# Patient Record
Sex: Male | Born: 1960
Health system: Southern US, Community
[De-identification: ages and names within clinical notes are randomized; demographics above are authoritative.]

## PROBLEM LIST (undated history)

## (undated) DIAGNOSIS — F419 Anxiety disorder, unspecified: Secondary | ICD-10-CM

## (undated) DIAGNOSIS — Z973 Presence of spectacles and contact lenses: Secondary | ICD-10-CM

## (undated) DIAGNOSIS — M1990 Unspecified osteoarthritis, unspecified site: Secondary | ICD-10-CM

## (undated) DIAGNOSIS — Z87442 Personal history of urinary calculi: Secondary | ICD-10-CM

## (undated) DIAGNOSIS — A4902 Methicillin resistant Staphylococcus aureus infection, unspecified site: Secondary | ICD-10-CM

## (undated) DIAGNOSIS — T8859XA Other complications of anesthesia, initial encounter: Secondary | ICD-10-CM

## (undated) DIAGNOSIS — B1920 Unspecified viral hepatitis C without hepatic coma: Secondary | ICD-10-CM

## (undated) DIAGNOSIS — T4145XA Adverse effect of unspecified anesthetic, initial encounter: Secondary | ICD-10-CM

## (undated) HISTORY — DX: Unspecified viral hepatitis C without hepatic coma: B19.20

## (undated) HISTORY — PX: OTHER SURGICAL HISTORY: SHX169

## (undated) HISTORY — PX: UPPER GI ENDOSCOPY: SHX6162

## (undated) HISTORY — DX: Methicillin resistant Staphylococcus aureus infection, unspecified site: A49.02

## (undated) HISTORY — DX: Unspecified osteoarthritis, unspecified site: M19.90

---

## 1997-10-23 ENCOUNTER — Emergency Department (HOSPITAL_COMMUNITY): Admission: EM | Admit: 1997-10-23 | Discharge: 1997-10-23 | Payer: Self-pay | Admitting: Emergency Medicine

## 1999-07-23 ENCOUNTER — Encounter: Payer: Self-pay | Admitting: Emergency Medicine

## 1999-07-23 ENCOUNTER — Emergency Department (HOSPITAL_COMMUNITY): Admission: EM | Admit: 1999-07-23 | Discharge: 1999-07-23 | Payer: Self-pay | Admitting: Emergency Medicine

## 2001-07-07 ENCOUNTER — Emergency Department (HOSPITAL_COMMUNITY): Admission: EM | Admit: 2001-07-07 | Discharge: 2001-07-07 | Payer: Self-pay | Admitting: Emergency Medicine

## 2001-07-08 ENCOUNTER — Emergency Department (HOSPITAL_COMMUNITY): Admission: EM | Admit: 2001-07-08 | Discharge: 2001-07-08 | Payer: Self-pay

## 2001-07-09 ENCOUNTER — Emergency Department (HOSPITAL_COMMUNITY): Admission: EM | Admit: 2001-07-09 | Discharge: 2001-07-09 | Payer: Self-pay | Admitting: Emergency Medicine

## 2001-07-10 ENCOUNTER — Emergency Department (HOSPITAL_COMMUNITY): Admission: EM | Admit: 2001-07-10 | Discharge: 2001-07-10 | Payer: Self-pay | Admitting: Emergency Medicine

## 2001-07-12 ENCOUNTER — Emergency Department (HOSPITAL_COMMUNITY): Admission: EM | Admit: 2001-07-12 | Discharge: 2001-07-12 | Payer: Self-pay | Admitting: *Deleted

## 2003-03-24 DIAGNOSIS — A4902 Methicillin resistant Staphylococcus aureus infection, unspecified site: Secondary | ICD-10-CM

## 2003-03-24 HISTORY — DX: Methicillin resistant Staphylococcus aureus infection, unspecified site: A49.02

## 2003-03-24 HISTORY — PX: INCISION / DRAINAGE HAND / FINGER: SUR695

## 2003-07-05 ENCOUNTER — Encounter: Admission: RE | Admit: 2003-07-05 | Discharge: 2003-07-05 | Payer: Self-pay | Admitting: Family Medicine

## 2003-07-05 ENCOUNTER — Inpatient Hospital Stay (HOSPITAL_COMMUNITY): Admission: EM | Admit: 2003-07-05 | Discharge: 2003-07-10 | Payer: Self-pay | Admitting: Emergency Medicine

## 2003-07-24 ENCOUNTER — Ambulatory Visit (HOSPITAL_COMMUNITY): Admission: RE | Admit: 2003-07-24 | Discharge: 2003-07-24 | Payer: Self-pay

## 2003-07-24 ENCOUNTER — Encounter (INDEPENDENT_AMBULATORY_CARE_PROVIDER_SITE_OTHER): Payer: Self-pay | Admitting: Specialist

## 2003-07-30 ENCOUNTER — Ambulatory Visit (HOSPITAL_COMMUNITY): Admission: RE | Admit: 2003-07-30 | Discharge: 2003-07-30 | Payer: Self-pay | Admitting: Gastroenterology

## 2003-10-26 ENCOUNTER — Ambulatory Visit (HOSPITAL_COMMUNITY): Admission: RE | Admit: 2003-10-26 | Discharge: 2003-10-26 | Payer: Self-pay | Admitting: *Deleted

## 2004-02-11 ENCOUNTER — Ambulatory Visit: Payer: Self-pay | Admitting: Gastroenterology

## 2004-03-10 ENCOUNTER — Ambulatory Visit: Payer: Self-pay | Admitting: Gastroenterology

## 2004-03-23 HISTORY — PX: VASECTOMY: SHX75

## 2004-04-10 ENCOUNTER — Ambulatory Visit: Payer: Self-pay | Admitting: Gastroenterology

## 2004-05-08 ENCOUNTER — Ambulatory Visit: Payer: Self-pay | Admitting: Gastroenterology

## 2004-06-05 ENCOUNTER — Ambulatory Visit: Payer: Self-pay | Admitting: Gastroenterology

## 2004-07-10 ENCOUNTER — Ambulatory Visit: Payer: Self-pay | Admitting: Gastroenterology

## 2004-08-07 ENCOUNTER — Ambulatory Visit: Payer: Self-pay | Admitting: Internal Medicine

## 2004-08-24 ENCOUNTER — Emergency Department (HOSPITAL_COMMUNITY): Admission: EM | Admit: 2004-08-24 | Discharge: 2004-08-24 | Payer: Self-pay | Admitting: Emergency Medicine

## 2004-08-29 ENCOUNTER — Ambulatory Visit (HOSPITAL_COMMUNITY): Admission: RE | Admit: 2004-08-29 | Discharge: 2004-08-29 | Payer: Self-pay | Admitting: Internal Medicine

## 2004-09-04 ENCOUNTER — Ambulatory Visit: Payer: Self-pay | Admitting: Internal Medicine

## 2004-10-02 ENCOUNTER — Ambulatory Visit: Payer: Self-pay | Admitting: Internal Medicine

## 2004-11-20 ENCOUNTER — Ambulatory Visit: Payer: Self-pay | Admitting: Gastroenterology

## 2004-12-25 ENCOUNTER — Ambulatory Visit: Payer: Self-pay | Admitting: Gastroenterology

## 2005-03-05 ENCOUNTER — Ambulatory Visit: Payer: Self-pay | Admitting: Gastroenterology

## 2005-05-28 ENCOUNTER — Ambulatory Visit: Payer: Self-pay | Admitting: Gastroenterology

## 2005-05-28 HISTORY — PX: APPENDECTOMY: SHX54

## 2005-06-01 ENCOUNTER — Ambulatory Visit (HOSPITAL_COMMUNITY): Admission: RE | Admit: 2005-06-01 | Discharge: 2005-06-01 | Payer: Self-pay | Admitting: Gastroenterology

## 2005-06-06 ENCOUNTER — Inpatient Hospital Stay (HOSPITAL_COMMUNITY): Admission: EM | Admit: 2005-06-06 | Discharge: 2005-06-08 | Payer: Self-pay | Admitting: Emergency Medicine

## 2005-06-08 ENCOUNTER — Encounter (INDEPENDENT_AMBULATORY_CARE_PROVIDER_SITE_OTHER): Payer: Self-pay | Admitting: *Deleted

## 2005-10-01 ENCOUNTER — Ambulatory Visit: Payer: Self-pay | Admitting: Gastroenterology

## 2006-04-01 ENCOUNTER — Ambulatory Visit: Payer: Self-pay | Admitting: Gastroenterology

## 2006-12-21 ENCOUNTER — Ambulatory Visit: Payer: Self-pay | Admitting: Gastroenterology

## 2007-01-03 ENCOUNTER — Encounter: Admission: RE | Admit: 2007-01-03 | Discharge: 2007-01-03 | Payer: Self-pay | Admitting: Obstetrics and Gynecology

## 2008-01-06 ENCOUNTER — Encounter: Admission: RE | Admit: 2008-01-06 | Discharge: 2008-01-06 | Payer: Self-pay | Admitting: Family Medicine

## 2008-01-09 ENCOUNTER — Encounter: Admission: RE | Admit: 2008-01-09 | Discharge: 2008-01-09 | Payer: Self-pay | Admitting: Family Medicine

## 2010-08-08 NOTE — Op Note (Signed)
NAME:  Daniel Nolan, Daniel Nolan NO.:  [id number]   MEDICAL RECORD NO.:  [id number]                   PATIENT TYPE:  OBV   LOCATION:  1829                                 FACILITY:  [facility]   PHYSICIAN:  Dionne Ano. Everlene Other, M.D.         DATE OF BIRTH:  03-25-1960   DATE OF PROCEDURE:  07/05/2003  DATE OF DISCHARGE:                                 OPERATIVE REPORT   PREOPERATIVE DIAGNOSIS:  Left small finger deep abscess and notable left  small finger purulent flexor tenosynovitis symptoms.   POSTOPERATIVE DIAGNOSIS:  Left small finger deep abscess and notable left  small finger purulent flexor tenosynovitis symptoms.   PROCEDURES:  1. Irrigation and debridement, deep abscess, left small finger, volar radial     in location.  2. Irrigation and debridement, flexor tendon sheath, left small finger,     including the palm and small finger flexor tendon sheath region.  3. Tenosynovectomy, flexor digitorum profundus and flexor digitorum     superficialis tendons, left small finger region.   SURGEON:  Dionne Ano. Amanda Pea, M.D.   COMPLICATIONS:  None.   ANESTHESIA:  General.   CULTURES:  X2 taken.   ESTIMATED BLOOD LOSS:  Minimal.   INDICATION FOR PROCEDURE:  This patient is a 50 year old male who presents  with the above-mentioned diagnosis.  The patient has been noted to have a  puncture wound to the finger which, unfortunately, has progressed despite  p.o. antibiotic administration.  He presented to [facility] emergency room  for me to see upon the referral from Dr. Manus Gunning in regard to his upper  extremity predicament.  He was noted to have ascending lymphangitis,  surrounding cellulitis, and a notable area of abscess volar radially, with  Kanavel's signs.  The patient and I discussed his upper extremity  predicament at length.  I discussed with him the risks of bleeding,  infection, anesthesia, damage to normal structures, and failure of surgery  to  accomplish its intended goals of relieving symptoms and restoring  function.  I had also discussed with him possible risk of loss of digit  given the severity of the infection.  With this in mind, he desired to  proceed.   The patient was taken to the operating suite and  underwent smooth induction  of anesthesia.  He was laid supine, appropriately padded, prepped and draped  in the usual sterile fashion about the left upper extremity.  Once the  sterile field was secured, the patient had I&D of a deep abscess about the  left small finger volar radial in location.  I removed skin and devitalized  subcutaneous tissue.  I carefully dissected the digital nerve and the  artery, kept these free from harm's way.  Two pockets were noted, one in the  volar pulp, one more proximally overlying the volar proximal phalanx region.  Both these areas were opened and Mylan pus was noted.  This was cultured  with aerobic and anaerobic cultures.  This was a heavy burden of necrotic  tissue.  This was debrided, taking care to be meticulous and preserve skin  viability and blood flow.  Following this, the patient then underwent  incision of the tendon sheath with egress of small amount of fluid,  certainly not of the infectious caliber as the abscess pocket itself, but  there was notable fluid in the flexor sheath.  Thus we made a counter  incision under tourniquet control at this time overlying the A1 pulley.  I  dissected down sharply through the skin, bluntly dissected to the A1 pulley,  opened this, and then irrigated it copiously.  There was once again no Coty  pus but certainly fluid in this area with associated tenosynovitis.  Due to  the tenosynovitis, which was very prominent and abnormal, I did perform a  tenosynovectomy of the FDP and FDS tendons in this region.  Following this I  irrigated copiously with multiple amounts of saline.  The flexor sheath was  irrigated copiously and the pockets.   Following this I then placed iodoform  gauze in the abscess pockets and overlying the A1 pulley.  The tourniquet  was deflated for the irrigation portion of the procedure and was only  briefly inflated during the procedure.  He had excellent refill and no  complicated features.  He was dressed sterilely, a volar plaster splint was  applied without difficulty, and he was extubated and taken to the recovery  room in stable condition.  He was immediately started on Unasyn.  He will be  admitted for Unasyn 3 g IV q.6h.  We will await culture results and plan for  a second look I&D in 48 hours and monitor his condition quite closely.  I  have discussed all issues with the family, and all questions certainly were  encouraged and answered.                                               Dionne Ano. Everlene Other, M.D.    Nash Mantis  D:  07/05/2003  T:  07/06/2003  Job:  [id number]   cc:   Bryan Lemma. Manus Gunning, M.D.  [address] [place]  [place] [address]  Fax: [phone]

## 2010-08-08 NOTE — Op Note (Signed)
NAME:  Daniel Nolan, Daniel Nolan NO.:  [id number]   MEDICAL RECORD NO.:  [id number]          PATIENT TYPE:  INP   LOCATION:  A312                          FACILITY:  [facility]   PHYSICIAN:  Dirk Dress. Katrinka Blazing, M.D.   DATE OF BIRTH:  December 04, 1960   DATE OF PROCEDURE:  06/07/2005  DATE OF DISCHARGE:  06/08/2005                                 OPERATIVE REPORT   PREOPERATIVE DIAGNOSIS:  Acute appendicitis.   POSTOPERATIVE DIAGNOSIS:  Acute appendicitis.   PROCEDURE:  Laparoscopic appendectomy.   SURGEON:  Dirk Dress. Katrinka Blazing, M.D.   DESCRIPTION:  Under general endotracheal anesthesia the patient's abdomen  was prepped and draped in a sterile field.  A supraumbilical midline  incision was made and a Veress needle was inserted uneventfully.  The  abdomen was insufflated with 3 liters of CO2.  Using a __________ guide, the  10 mm port was placed.  A laparoscope was placed.  The area of the inflamed  appendix was identified.  The patient placed in reverse Trendelenburg  position and under videoscopic guidance a 5 mm port was placed in the  suprapubic midline and a 12 mm port was placed in the left lower quadrant.  The appendix was dissected.  It was densely adherent to the lateral pelvic  gutter.  Using blunt dissection, it was separated from the lateral pelvic  perineum.  There was some purulent material that was spilled.  It was  immediately suctioned and irrigated.  The appendix was fully dissected.  Mesoappendix was serially dissected, clipped with multiple clips and  divided.  The base of the appendix was transected using the endo-GIA  standard stapler.  The staple line was secured, there was no bleeding.  Irrigation, copious irrigation, was carried out.  There was no bleeding from  the mesoappendix.  The pelvis was irrigated and suctioned.  A JP drain was  placed.  It was placed in the right lower gutter and in the pelvis.  It was  brought out through the suprapubic midline.  CO2 was  allowed to escape from  the abdomen and the ports were removed.  The incision at the umbilicus was  closed with #0 Vicryl and staples.  The other incisions were closed with  staples.  Drain was secured with #3-0 nylon.  The patient tolerated the  procedure well.  He was awakened from anesthesia uneventfully.  A dressing  was placed.  He was transferred to a bed and taken to the Post Anesthetic  Care Unit for further monitoring.      Dirk Dress. Katrinka Blazing, M.D.  Electronically Signed     LCS/MEDQ  D:  06/07/2005  T:  06/09/2005  Job:  [id number]   cc:   Donia Guiles, M.D.  Fax: [phone]

## 2010-08-08 NOTE — Op Note (Signed)
NAME:  CHESNEY, KLIMASZEWSKI NO.:  [id number]   MEDICAL RECORD NO.:  [id number]                   PATIENT TYPE:  OBV   LOCATION:  5041                                 FACILITY:  [facility]   PHYSICIAN:  Dionne Ano. Everlene Other, M.D.         DATE OF BIRTH:  Jun 18, 1960   DATE OF PROCEDURE:  07/07/2003  DATE OF DISCHARGE:                                 OPERATIVE REPORT   PREOPERATIVE DIAGNOSIS:  Left hand and small finger abscess, deep in  location.   POSTOPERATIVE DIAGNOSIS:  Left hand and small finger abscess, deep in  location.   OPERATION PERFORMED:  Incision and drainage left hand deep abscess with  three drains placed and removal of prenecrotic tissue and devitalized tissue  edges.   SURGEON:  Dionne Ano. Amanda Pea, M.D.   ASSISTANT:  None.   ANESTHESIA:  General.   COMPLICATIONS:  None.   DRAINS:  Three.   INDICATIONS FOR PROCEDURE:  The patient is a very pleasant 50 year old male  who presents with the above mentioned diagnosis.  He presents for a second  washout  and look at the finger.  He has had a stable CBC, improving white  count.  He is growing out Gram positive cocci in clusters and has been on  Unasyn.  He understands risks and benefits of surgery and the aggressive  approach to salvage of his finger.  All questions have been encouraged and  answered.   DESCRIPTION OF PROCEDURE:  The patient was seen by myself and anesthesia,  was taken to the operative suite, underwent a smooth induction of  anesthesia.  Following this, he was laid supine and appropriately padded and  prepped and draped in the usual sterile fashion.  We then removed the drains  in his finger.  Following this, we prepped and draped him with Betadine  scrub and paint and then performed sterile field application. Once this was  done, we then performed an aggressive incision and drainage of skin and  subcutaneous tissue, removing prenecrotic skin subcutaneous and deeper  nonviable tissue.  I identified the flexor tendon sheaths.  They looked  well.  There was no purulent reaccumulation of significance.  I took  cultures intraoperatively, aerobic and anaerobic and then following this,  then irrigated the wound space at the A-1 pulley region and at the volar  radial aspect of the left small finger.  I was pleased at the overall  conditions.  There was still some erythema present but certainly no  aggressive reaccumulation of the purulence. We irrigated copiously with  greater than 2L of fluid, then packed the wounds with iodoform gauze, 1/4  inch in variety.  Following this, we then noted excellent refill.  I placed  him in a sterile dressing and he was then extubated and taken to recovery  room.  The patient tolerated the procedure well.  All sponge, needle and  instrument counts were reported as correct.  His wound conditions are overall improved. He is going to continue to  require IV antibiotics as well as appropriate attention to his wound.  If  the wound continues to look well, I will advance him to whirlpool status. I  still await the  final cultures, although as noted above, he is growing out Gram positive  cocci.  I have discussed all findings with him and his wife.  We will  continue aggressive approach to try to salvage the digit and give him a  functional finger.  We have discussed the proposed treatment algorithm etc.  and all questions have been encouraged and answered.                                               Dionne Ano. Everlene Other, M.D.    Nash Mantis  D:  07/07/2003  T:  07/09/2003  Job:  [id number]

## 2010-08-08 NOTE — Discharge Summary (Signed)
NAME:  Daniel Nolan, Daniel Nolan NO.:  [id number]   MEDICAL RECORD NO.:  [id number]          PATIENT TYPE:  INP   LOCATION:  A312                          FACILITY:  [facility]   PHYSICIAN:  Dirk Dress. Katrinka Blazing, M.D.   DATE OF BIRTH:  1961/01/13   DATE OF ADMISSION:  06/06/2005  DATE OF DISCHARGE:  03/19/2007LH                                 DISCHARGE SUMMARY   DISCHARGE DIAGNOSES:  1.  Acute appendicitis.  2.  Hepatitis C.  3.  Cirrhotic liver disease.  4.  History of multiple drug use.   SPECIAL PROCEDURE:  Laparoscopic appendectomy.   DISPOSITION:  The patient discharged home in stable satisfactory condition.   DISCHARGE MEDICATIONS:  Levaquin 750 mg daily, Zelnorm 6 mg twice daily,  Reglan 10 mg before meals and at bedtime, Percocet one to two every 4 hours  as needed for pain.   FOLLOW UP:  The patient scheduled to be seen in the office 10 days post  discharge.   SUMMARY:  50 year old male admitted for evaluation of abdominal pain.  He  gives a history of acute onset of pain in his lower abdomen.  He was seen by  local physician and treated for gastroenteritis.  He had nausea without  vomiting.  His pain worsened and he was seen in the emergency room.  He was  noted to have tenderness in the right lower quadrant with signs and symptoms  of appendicitis.  CT scan was done and this showed appendicitis.  The  patient was treated with IV antibiotics and scheduled for appendectomy.  Laparoscopic appendectomy was done on March 18.  He had acute gangrenous  appendicitis with early abscess formation.  JP drain was placed.  He did  well in the postoperative period and was discharged home on the first  postoperative day in satisfactory condition with plans to allow his drain to  remain intact and treatment with Levaquin for 10 days orally.      Dirk Dress. Katrinka Blazing, M.D.  Electronically Signed     LCS/MEDQ  D:  08/08/2005  T:  08/09/2005  Job:  [id number]

## 2010-08-08 NOTE — H&P (Signed)
NAME:  Daniel Nolan, Daniel Nolan NO.:  [id number]   MEDICAL RECORD NO.:  [id number]          PATIENT TYPE:  INP   LOCATION:  A312                          FACILITY:  [facility]   PHYSICIAN:  Dirk Dress. Katrinka Blazing, M.D.   DATE OF BIRTH:  1960/07/25   DATE OF ADMISSION:  06/06/2005  DATE OF DISCHARGE:  LH                                HISTORY & PHYSICAL   HISTORY OF PRESENT ILLNESS:  The patient is a 50 year old male admitted for  evaluation of abdominal pain.  The patient gives a history of acute onset of  crampy abdominal pain in his lower abdomen on Thursday.  He was seen by his  local physician who felt as if he had gastroenteritis.  He had nausea but no  vomiting.  The pain became worse since he saw the physician in the emergency  room.  In the emergency room he was noted to have tenderness in the right  lower quadrant with signs and symptoms of appendicitis.  Because of delay  due to CT scanning the patient was admitted and subsequently had a CT scan  which showed acute appendicitis.  He is admitted and will be treated with IV  antibiotics and will have a laparoscopic appendectomy.   PAST HISTORY:  He has a history of cirrhotic liver disease stage 4.  He also  has hepatitis C but recent titers were undetectable.   SOCIAL HISTORY:  No tobacco or alcohol use; prior history of heavy drug use  including IV drugs which he thinks is the source of his hepatitis C.  He is  employed as a Engineer, maintenance.   ALLERGIES:  He has no known drug allergies.   EXAMINATION:  VITAL SIGNS:  Blood pressure 116/71, pulse 84, respirations  20, temperature 102.4, weight 176 pounds, O2 saturation 97%.  HEENT:  Unremarkable.  There is no jaundice.  NECK:  Supple.  No JVD, bruit, adenopathy, or thyromegaly.  CHEST:  Clear to auscultation.  HEART:  Regular rate and rhythm without murmur, gallop, or rub.  ABDOMEN:  Soft, nondistended, mild tenderness right lower quadrant in  hypergastrium and normoactive  bowel sounds.  EXTREMITIES:  No cyanosis, clubbing or edema.  No joint deformity.  NEUROLOGIC:  No focal motor, sensory or cerebellar deficit.  Cranial nerves  intact.   IMPRESSION:  1.  Acute appendicitis.  2.  Stage 4 cirrhotic liver disease.  3.  Hepatitis C.   PLAN:  The patient is treated with antibiotics and he will scheduled  laparoscopic appendectomy.      Dirk Dress. Katrinka Blazing, M.D.  Electronically Signed     LCS/MEDQ  D:  06/07/2005  T:  06/07/2005  Job:  [id number]

## 2010-08-08 NOTE — Discharge Summary (Signed)
NAME:  Daniel Nolan, Daniel Nolan NO.:  [id number]   MEDICAL RECORD NO.:  [id number]                   PATIENT TYPE:  INP   LOCATION:  5041                                 FACILITY:  [facility]   PHYSICIAN:  Dionne Ano. Amanda Pea, M.D.             DATE OF BIRTH:  11/15/1960   DATE OF ADMISSION:  07/05/2003  DATE OF DISCHARGE:  07/10/2003                                 DISCHARGE SUMMARY   ADMISSION DIAGNOSES:  1. Severe deep soft tissue abscess of the left finger and hand with possible     purulent flexor tendon synovitis.  2. History of hepatitis C.  3. History of intravenous drug use with last use in the 1970s.   DISCHARGE DIAGNOSES:  1. Severe deep soft tissue abscess of the left finger and hand with possible     purulent flexor tendon synovitis, improved.  2. History of hepatitis C.  3. History of intravenous drug use with last use in the 1970s.   SURGEON:  Dionne Ano. Amanda Pea, M.D.   PROCEDURES:  I&D deep abscess of left small finger with I&D of flexor  sheath, anterior synovectomy of the left small finger FTS and FTP with  repeat washout.   CONSULTATIONS:  Infectious disease.   HISTORY OF PRESENT ILLNESS:  Daniel Nolan is a 50 year old, right-hand-  dominant gentleman who presented in the ER setting for hand surgery  consultation and evaluation secondary to pain, swelling, and signs of  infection of the left small finger, treated with p.o. antibiotics in the  past without success.  He presented to the emergency room. Surgeon, Dionne Ano. Amanda Pea, M.D., was contacted and evaluated the patient.  He was noted to  have severe soft tissue abscess with soft tissue swelling and erythema with  attending synovitis  and presumptive diagnosis of flexor tendon synovitis  given his positive Kanavel signs.  Apparently this stemmed from an old  splinter-type injury.  Due to the nature of the upper extremity, the  decision made to admit him to undergo emergent I&D and plan  for  postoperative IV antibiotics, pain medications, wound care, etc.   HOSPITAL COURSE:  Daniel Nolan was admitted on July 05, 2003, where he  underwent I&D deep abscess about the left small finger as well as his flexor  tendon sheath, left small finger and palm and synovectomy about the left  small finger FTS and FTP.  He tolerated the procedure well and was started  on IV Unasyn as well as typical narcotic regimen for pain control.  Therapy  was consulted for wound care.  His intraoperative cultures showed gram-  positive cocci on the Gram stain.  Final cultures were awaiting.  His vital  signs were stable.  His pain was controlled.  He was afebrile.  The patient  continued wound care, elevation, and range of motion, and a second I&D was  performed on July 07, 2003, without difficulty.  Final  cultures were  obtained which showed methicillin-resistant Staphylococcus aureus.  Infectious disease was, therefore, consulted.  Per their recommendations, a  PICC line was placed with IV vancomycin needed for two weeks and then  additional p.o. antibiotic coverage in the form of doxycycline, clinimycin,  or Bactrim.  The patient's wound continued to improved with hydrotherapy and  close observation.  Home health was arranged for his discharge needs on  July 10, 2003.  The patient was doing well.  His PICC line was in place.  His wound had certainly improved.  His vital signs were stable.  He was  afebrile, and decision was made to discharge home with home health care for  maintenance of PICC line, IV vancomycin. 1250 mg b.i.d.   FINAL DIAGNOSES:  1. Flexor tendon synovitis, left hand, and small finger abscess with     positive methicillin-resistant Staphylococcus aureus.  2. History of hepatitis C.  3. History of intravenous drug abuse with cessation since the 1970s.   CONDITION ON DISCHARGE:  Improved.   DIET:  Regular.   ACTIVITY:  He will continue elevation, range of motion, massage  to the  digits.   WOUND CARE:  Will keep his dressings clean, dry, and intact.  Home health  PT, OT will provide dressing changes and wound care.   DISCHARGE MEDICATIONS:  1. Percocet 5/325 one to two p.o. q.4-6h. p.r.n. pain.  2. Robaxin 500 mg p.o. q.6-8h. p.r.n. spasm.  3. Over-the-counter Colace to prevent constipation.  4. Continued vancomycin 1250 mg b.i.d. per IV until he is two weeks out, and     then he will be switched to a p.o. antibiotics per sensitivities.   FOLLOW UP:  He will follow up in the office at 2 p.m. on April 20 to begin  whirlpool, and we will closely observe his condition in the office setting.      Karie Chimera, P.A.-C.                   Dionne Ano. Amanda Pea, M.D.    BB/MEDQ  D:  09/03/2003  T:  09/03/2003  Job:  [id number]

## 2010-08-08 NOTE — H&P (Signed)
NAME:  Daniel Nolan, DURFLINGER NO.:  [id number]   MEDICAL RECORD NO.:  [id number]          PATIENT TYPE:  INP   LOCATION:  A312                          FACILITY:  [facility]   PHYSICIAN:  Dirk Dress. Katrinka Blazing, M.D.   DATE OF BIRTH:  06-11-1960   DATE OF ADMISSION:  06/06/2005  DATE OF DISCHARGE:  LH                                HISTORY & PHYSICAL   This is a 50 year old who had acute onset of crampy, lower abdominal pain on  Thursday.  The pain was in the hypogastrium and right lower quadrant.  He  had mild nausea without vomiting.  He was seen by his primary physician who  told him that he might have gastroenteritis.  His pain became worse and  totally localized to the right lower quadrant.  He came to the emergency  room yesterday and was seen in the emergency room.  CT scan was down but the  ER physician felt that he had appendicitis.  He had diffuse tenderness with  guarding in the right lower quadrant.  The patient was admitted and he  subsequently had CT of the abdomen which shows inflammation of the appendix  with some peri-appendiceal inflammation.  He is admitted and will undergo  laparoscopic appendectomy.   PAST HISTORY:  He has history of cirrhotic liver disease, stage IV, has a  history of hepatitis C but his last titers were not detectable.  He has been  on therapy for hepatitis C.  He has not had any previous surgery.   SOCIAL HISTORY:  He is employed as a Engineer, maintenance.  He does not smoke or  drink but he had a long history of extensive drug use including all types of  drugs including IV drug use.  He feels that this is the source of his  hepatitis C.  He has not used drugs for about 15 years.   ALLERGIES:  He has no known drug allergies.   PHYSICAL EXAMINATION:  VITAL SIGNS:  Blood pressure 116/71, pulse 84,  respirations 20, temperature 102.4, weight 176 pounds, O2 saturation 97%.  HEENT:  Unremarkable.  There is no jaundice.  NECK:  Supple, no JVD, bruit,  adenopathy, or thyromegaly.  CHEST:  Clear to auscultation, no rales, rubs, rhonchi, or wheezes.  HEART:  Regular rate and rhythm without murmur, gallop, or rub.  ABDOMEN:  Obese, soft, with mild tenderness in the right lower quadrant and  hypogastrium.  No masses.  Good active bowel sounds, no upper abdominal  tenderness.  I cannot detect any hepatosplenomegaly or splenomegaly.  EXTREMITIES:  No cyanosis, clubbing, or edema, no joint deformity.  NEUROLOGIC:  Cranial nerves are intact.  No motor, sensory, or cerebellar  deficit.  No lateralizing signs.   LABORATORY DATA:  White count 12,100, hemoglobin 13.3, hematocrit 39.8,  platelets are 125,000.  PT is 17, PTT was not done.   IMPRESSION:  1.  Acute appendicitis.  2.  Hepatitis C.  3.  Stage IV cirrhotic liver disease.   PLAN:  The patient is made NPO and will have laparoscopic appendectomy later  today.  Dirk Dress. Katrinka Blazing, M.D.  Electronically Signed     LCS/MEDQ  D:  06/07/2005  T:  06/07/2005  Job:  [id number]

## 2013-08-18 ENCOUNTER — Telehealth (INDEPENDENT_AMBULATORY_CARE_PROVIDER_SITE_OTHER): Payer: Self-pay | Admitting: Surgery

## 2013-08-18 ENCOUNTER — Encounter (INDEPENDENT_AMBULATORY_CARE_PROVIDER_SITE_OTHER): Payer: Self-pay | Admitting: Surgery

## 2013-08-18 ENCOUNTER — Ambulatory Visit (INDEPENDENT_AMBULATORY_CARE_PROVIDER_SITE_OTHER): Payer: Managed Care, Other (non HMO) | Admitting: Surgery

## 2013-08-18 VITALS — BP 130/80 | HR 67 | Temp 97.2°F | Ht 66.0 in | Wt 197.0 lb

## 2013-08-18 DIAGNOSIS — E669 Obesity, unspecified: Secondary | ICD-10-CM

## 2013-08-18 DIAGNOSIS — B1920 Unspecified viral hepatitis C without hepatic coma: Secondary | ICD-10-CM

## 2013-08-18 DIAGNOSIS — M1990 Unspecified osteoarthritis, unspecified site: Secondary | ICD-10-CM | POA: Insufficient documentation

## 2013-08-18 DIAGNOSIS — K430 Incisional hernia with obstruction, without gangrene: Secondary | ICD-10-CM | POA: Insufficient documentation

## 2013-08-18 DIAGNOSIS — K420 Umbilical hernia with obstruction, without gangrene: Secondary | ICD-10-CM

## 2013-08-18 MED ORDER — VANCOMYCIN HCL 10 G IV SOLR: 1500.0000 mg | INTRAVENOUS | Status: DC

## 2013-08-18 NOTE — Telephone Encounter (Signed)
pt made aware of deposit will call back when able to schedule surgery orders on file

## 2013-08-18 NOTE — Patient Instructions (Addendum)
Please consider the recommendations that we have given you today:  Consider surgery to repair the hernia at your bellybutton.  Recommend laparoscopic approach with mesh to help reinforce the repair  See the Handout(s) we have given you.  Please call our office at [phone] if you wish to schedule surgery or if you have further questions / concerns.   Hernia A hernia occurs when an internal organ pushes out through a weak spot in the abdominal wall. Hernias most commonly occur in the groin and around the navel. Hernias often can be pushed back into place (reduced). Most hernias tend to get worse over time. Some abdominal hernias can get stuck in the opening (irreducible or incarcerated hernia) and cannot be reduced. An irreducible abdominal hernia which is tightly squeezed into the opening is at risk for impaired blood supply (strangulated hernia). A strangulated hernia is a medical emergency. Because of the risk for an irreducible or strangulated hernia, surgery may be recommended to repair a hernia. CAUSES   Heavy lifting.  Prolonged coughing.  Straining to have a bowel movement.  A cut (incision) made during an abdominal surgery. HOME CARE INSTRUCTIONS   Bed rest is not required. You may continue your normal activities.  Avoid lifting more than 10 pounds (4.5 kg) or straining.  Cough gently. If you are a smoker it is best to stop. Even the best hernia repair can break down with the continual strain of coughing. Even if you do not have your hernia repaired, a cough will continue to aggravate the problem.  Do not wear anything tight over your hernia. Do not try to keep it in with an outside bandage or truss. These can damage abdominal contents if they are trapped within the hernia sac.  Eat a normal diet.  Avoid constipation. Straining over long periods of time will increase hernia size and encourage breakdown of repairs. If you cannot do this with diet alone, stool softeners may  be used. SEEK IMMEDIATE MEDICAL CARE IF:   You have a fever.  You develop increasing abdominal pain.  You feel nauseous or vomit.  Your hernia is stuck outside the abdomen, looks discolored, feels hard, or is tender.  You have any changes in your bowel habits or in the hernia that are unusual for you.  You have increased pain or swelling around the hernia.  You cannot push the hernia back in place by applying gentle pressure while lying down. MAKE SURE YOU:   Understand these instructions.  Will watch your condition.  Will get help right away if you are not doing well or get worse. Document Released: 03/09/2005 Document Revised: 06/01/2011 Document Reviewed: 10/27/2007 [facility] Patient Information 2014 [place].  HERNIA REPAIR: POST OP INSTRUCTIONS  1. DIET: Follow a light bland diet the first 24 hours after arrival home, such as soup, liquids, crackers, etc.  Be sure to include lots of fluids daily.  Avoid fast food or heavy meals as your are more likely to get nauseated.  Eat a low fat the next few days after surgery. 2. Take your usually prescribed home medications unless otherwise directed. 3. PAIN CONTROL: a. Pain is best controlled by a usual combination of three different methods TOGETHER: i. Ice/Heat ii. Over the counter pain medication iii. Prescription pain medication b. Most patients will experience some swelling and bruising around the hernia(s) such as the bellybutton, groins, or old incisions.  Ice packs or heating pads (30-60 minutes up to 6 times a day) will help.  Use ice for the first few days to help decrease swelling and bruising, then switch to heat to help relax tight/sore spots and speed recovery.  Some people prefer to use ice alone, heat alone, alternating between ice & heat.  Experiment to what works for you.  Swelling and bruising can take several weeks to resolve.   c. It is helpful to take an over-the-counter pain medication regularly for the  first few weeks.  Choose one of the following that works best for you: i. Naproxen (Aleve, etc)  Two 220mg  tabs twice a day ii. Ibuprofen (Advil, etc) Three 200mg  tabs four times a day (every meal & bedtime) iii. Acetaminophen (Tylenol, etc) 325-650mg  four times a day (every meal & bedtime) d. A  prescription for pain medication should be given to you upon discharge.  Take your pain medication as prescribed.  i. If you are having problems/concerns with the prescription medicine (does not control pain, nausea, vomiting, rash, itching, etc), please call us [phone] to see if we need to switch you to a different pain medicine that will work better for you and/or control your side effect better. ii. If you need a refill on your pain medication, please contact your pharmacy.  They will contact our office to request authorization. Prescriptions will not be filled after 5 pm or on week-ends. 4. Avoid getting constipated.  Between the surgery and the pain medications, it is common to experience some constipation.  Increasing fluid intake and taking a fiber supplement (such as Metamucil, Citrucel, FiberCon, MiraLax, etc) 1-2 times a day regularly will usually help prevent this problem from occurring.  A mild laxative (prune juice, Milk of Magnesia, MiraLax, etc) should be taken according to package directions if there are no bowel movements after 48 hours.   5. Wash / shower every day.  You may shower over the dressings as they are waterproof.   6. Remove your waterproof bandages 5 days after surgery.  You may leave the incision open to air.  You may replace a dressing/Band-Aid to cover the incision for comfort if you wish.  Continue to shower over incision(s) after the dressing is off.    7. ACTIVITIES as tolerated:   a. You may resume regular (light) daily activities beginning the next day-such as daily self-care, walking, climbing stairs-gradually increasing activities as tolerated.  If you can walk 30  minutes without difficulty, it is safe to try more intense activity such as jogging, treadmill, bicycling, low-impact aerobics, swimming, etc. b. Save the most intensive and strenuous activity for last such as sit-ups, heavy lifting, contact sports, etc  Refrain from any heavy lifting or straining until you are off narcotics for pain control.   c. DO NOT PUSH THROUGH PAIN.  Let pain be your guide: If it hurts to do something, don't do it.  Pain is your body warning you to avoid that activity for another week until the pain goes down. d. You may drive when you are no longer taking prescription pain medication, you can comfortably wear a seatbelt, and you can safely maneuver your car and apply brakes. e. Dennis Bast may have sexual intercourse when it is comfortable.  8. FOLLOW UP in our office a. Please call [facility] at [phone] to set up an appointment to see your surgeon in the office for a follow-up appointment approximately 2-3 weeks after your surgery. b. Make sure that you call for this appointment the day you arrive home to insure a convenient appointment time.  9.  IF YOU HAVE DISABILITY OR FAMILY LEAVE FORMS, BRING THEM TO THE OFFICE FOR PROCESSING.  DO NOT GIVE THEM TO YOUR DOCTOR.  WHEN TO CALL us [phone]: 1. Poor pain control 2. Reactions / problems with new medications (rash/itching, nausea, etc)  3. Fever over 101.5 F (38.5 C) 4. Inability to urinate 5. Nausea and/or vomiting 6. Worsening swelling or bruising 7. Continued bleeding from incision. 8. Increased pain, redness, or drainage from the incision   The clinic staff is available to answer your questions during regular business hours (8:30am-5pm).  Please don't hesitate to call and ask to speak to one of our nurses for clinical concerns.   If you have a medical emergency, go to the nearest emergency room or call 911.  A surgeon from [facility] is always on call at the hospitals in [facility], [place], [place], [place], [place]  [address] ?  [address] [address], [place],    [address] MAIN: [phone] ? TOLL FREE: [phone] ? FAX: [phone] [email or web address]  Hepatitis C Hepatitis C is a viral infection of the liver. Infection may go undetected for months or years because symptoms may be absent or very mild. Chronic liver disease is the main danger of hepatitis C. This may lead to scarring of the liver (cirrhosis), liver failure, and liver cancer. CAUSES  Hepatitis C is caused by the hepatitis C virus (HCV). Formerly, hepatitis C infections were most commonly transmitted through blood transfusions. In the early 1990s, routine testing of donated blood for hepatitis C and exclusion of blood that tests positive for HCV began. Now, HCV is most commonly transmitted from person to person through injection drug use, sharing needles, or sex with an infected person. A caregiver may also get the infection from exposure to the blood of an infected patient by way of a cut or needle stick.  SYMPTOMS  Acute Phase Many cases of acute HCV infection are mild and cause few problems.Some people may not even realize they are sick.Symptoms in others may last a few weeks to several months and include:  Feeling very tired.  Loss of appetite.  Nausea.  Vomiting.  Abdominal pain.  Dark yellow urine.  Yellow skin and eyes (jaundice).  Itching of the skin. Chronic Phase  Between 50% to 85% of people who get HCV infection become "chronic carriers." They often have no symptoms, but the virus stays in their body.They may spread the virus to others and can get long-term liver disease.  Many people with chronic HCV infection remain healthy for many years. However, up to 1 in 5 chronically infected people may develop severe liver diseases including scarring of the liver (cirrhosis), liver failure, or liver cancer. DIAGNOSIS  Diagnosis of hepatitis C  infection is made by testing blood for the presence of hepatitis C viral particles called RNA. Other tests may also be done to measure the status of current liver function, exclude other liver problems, or assess liver damage. TREATMENT  Treatment with many antiviral drugs is available and recommended for some patients with chronic HCV infection. Drug treatment is generally considered appropriate for patients who:  Are 99 years of age or older.  Have a positive test for HCV particles in the blood.  Have a liver tissue sample (biopsy) that shows chronic hepatitis and significant scarring (fibrosis).  Do not have signs of liver failure.  Have acceptable blood test results that confirm the wellness of other body  organs.  Are willing to be treated and conform to treatment requirements.  Have no other circumstances that would prevent treatment from being recommended (contraindications). All people who are offered and choose to receive drug treatment must understand that careful medical follow up for many months and even years is crucial in order to make successful care possible. The goal of drug treatment is to eliminate any evidence of HCV in the blood on a long-term basis. This is called a "sustained virologic response" or SVR. Achieving a SVR is associated with a decrease in the chance of life-threatening liver problems, need for a liver transplant, liver cancer rates, and liver-related complications. Successful treatment currently requires taking treatment drugs for at least 24 weeks and up to 72 weeks. An injected drug (interferon) given weekly and an oral antiviral medicine taken daily are usually prescribed. Side effects from these drugs are common and some may be very serious. Your response to treatment must be carefully monitored by both you and your caregiver throughout the entire treatment period. PREVENTION There is no vaccine for hepatitis C. The only way to prevent the disease is to  reduce the risk of exposure to the virus.   Avoid sharing drug needles or personal items like toothbrushes, razors, and nail clippers with an infected person.  Healthcare workers need to avoid injuries and wear appropriate protective equipment such as gloves, gowns, and face masks when performing invasive medical or nursing procedures. HOME CARE INSTRUCTIONS  To avoid making your liver disease worse:  Strictly avoid drinking alcohol.  Carefully review all new prescriptions of medicines with your caregiver. Ask your caregiver which drugs you should avoid. The following drugs are toxic to the liver, and your caregiver may tell you to avoid them:  Isoniazid.  Methyldopa.  Acetaminophen.  Anabolic steroids (muscle-building drugs).  Erythromycin.  Oral contraceptives (birth control pills).  Check with your caregiver to make sure medicine you are currently taking will not be harmful.  Periodic blood tests may be required. Follow your caregiver's advice about when you should have blood tests.  Avoid a sexual relationship until advised otherwise by your caregiver.  Avoid activities that could expose other people to your blood. Examples include sharing a toothbrush, nail clippers, razors, and needles.  Bed rest is not necessary, but it may make you feel better. Recovery time is not related to the amount of rest you receive.  This infection is contagious. Follow your caregiver's instructions in order to avoid spread of the infection. SEEK IMMEDIATE MEDICAL CARE IF:  You have increasing fatigue or weakness.  You have an oral temperature above 102 F (38.9 C), not controlled by medicine.  You develop loss of appetite, nausea, or vomiting.  You develop jaundice.  You develop easy bruising or bleeding.  You develop any severe problems as a result of your treatment. MAKE SURE YOU:   Understand these instructions.  Will watch your condition.  Will get help right away if you are  not doing well or get worse. Document Released: 03/06/2000 Document Revised: 06/01/2011 Document Reviewed: 07/09/2010 [facility] Patient Information 2014 [place], [place].

## 2013-08-18 NOTE — Progress Notes (Signed)
Subjective:     Patient ID: Daniel Nolan, male   DOB: Jul 01, 1960, 53 y.o.   MRN: [id number]  HPI  Note: Portions of this report may have been transcribed using voice recognition software. Every effort was made to ensure accuracy; however, inadvertent computerized transcription errors may be present.   Any transcriptional errors that result from this process are unintentional.            Daniel Nolan  [birth year] [id number]  Patient Care Team: Gaynelle Arabian, MD as PCP - General (Family Medicine) Denita Lung, MD as Consulting Physician (Gastroenterology)  This patient is a 52 y.o.male who presents today for surgical evaluation at the request of Dr. Marisue Humble.   Reason for visit: Umbilical hernia  Pleasant active male.   Noted swelling around his bellybutton.  It is gotten larger.  Family concern.  Discuss her primary care physician.  Concern for possible umbilical hernia.  History of hepatitis C infection in the past.  Followed by you and see hepatology.  CT scan concerning for psoriasis.  MRI not concerning for it.  Liver function tests have been rather normal the past few years.  No history of jaundice, encephalopathy, ascites.  Patient did have laparoscopic appendectomy through incision near bellybutton a few years ago for perforated appendicitis.  Recalls needing a drain.  Tolerated that as well.  No complications with anesthesia.  Denies any history of mental status changes or ascites.  No history of jaundice or liver failure.  He does moderate activity at his work.  Can hike 3 miles without difficulty.  Usually has a few bowel movements a day.  History of MRSA infection in his hand 8 years ago treated with incision and drainage.  No infections since.  Patient Active Problem List   Diagnosis Date Noted  . Incisional hernia, umbilical - incarcerated 08/18/2013  . Obesity (BMI 30-39.9) 08/18/2013  . HCV (hepatitis C virus)   . Arthritis     Past Medical History    Diagnosis Date  . Arthritis   . HCV (hepatitis C virus)     Past Surgical History  Procedure Laterality Date  . Appendectomy  05/28/2005    Dr Irving Shows  . Incision / drainage hand / finger  2005    Incision and drainage left hand deep abscess with  . Vasectomy  2006    History   Social History  . Marital Status: Married    Spouse Name: N/A    Number of Children: N/A  . Years of Education: N/A   Occupational History  . Not on file.   Social History Main Topics  . Smoking status: Former Smoker    Types: Cigarettes    Quit date: 08/18/1976  . Smokeless tobacco: Not on file  . Alcohol Use: Yes  . Drug Use: No  . Sexual Activity: Not on file   Other Topics Concern  . Not on file   Social History Narrative  . No narrative on file    Family History  Problem Relation Age of Onset  . Cancer Mother     brain  . Heart disease Father     No current outpatient prescriptions on file.   No current facility-administered medications for this visit.     No Known Allergies  BP 130/80  Pulse 67  Temp(Src) 97.2 F (36.2 C)  Ht 5\' 6"  (1.676 m)  Wt 197 lb (89.359 kg)  BMI 31.81 kg/m2  No results found.   Review of  Systems  Constitutional: Negative for fever, chills and diaphoresis.  HENT: Positive for hearing loss. Negative for ear discharge, facial swelling, mouth sores, nosebleeds, sore throat and trouble swallowing.   Eyes: Negative for photophobia, discharge and visual disturbance.       Wears glasses  Respiratory: Negative for choking, chest tightness, shortness of breath and stridor.   Cardiovascular: Negative for chest pain and palpitations.  Gastrointestinal: Negative for nausea, vomiting, abdominal pain, diarrhea, constipation, blood in stool, abdominal distention, anal bleeding and rectal pain.  Endocrine: Negative for cold intolerance and heat intolerance.  Genitourinary: Negative for dysuria, urgency, difficulty urinating and testicular pain.   Musculoskeletal: Negative for arthralgias, back pain, gait problem and myalgias.  Skin: Negative for color change, pallor, rash and wound.  Allergic/Immunologic: Negative for environmental allergies and food allergies.  Neurological: Negative for dizziness, speech difficulty, weakness, numbness and headaches.  Hematological: Negative for adenopathy. Does not bruise/bleed easily.  Psychiatric/Behavioral: Negative for hallucinations, confusion and agitation.       Objective:   Physical Exam  Constitutional: He is oriented to person, place, and time. He appears well-developed and well-nourished. No distress.  HENT:  Head: Normocephalic.  Mouth/Throat: Oropharynx is clear and moist. No oropharyngeal exudate.  Eyes: Conjunctivae and EOM are normal. Pupils are equal, round, and reactive to light. No scleral icterus.  Neck: Normal range of motion. Neck supple. No tracheal deviation present.  Cardiovascular: Normal rate, regular rhythm and intact distal pulses.   Pulmonary/Chest: Effort normal and breath sounds normal. No respiratory distress.  Abdominal: Soft. He exhibits no distension. There is no tenderness. A hernia is present. Hernia confirmed positive in the ventral area. Hernia confirmed negative in the right inguinal area and confirmed negative in the left inguinal area.    Musculoskeletal: Normal range of motion. He exhibits no tenderness.  Lymphadenopathy:    He has no cervical adenopathy.       Right: No inguinal adenopathy present.       Left: No inguinal adenopathy present.  Neurological: He is alert and oriented to person, place, and time. No cranial nerve deficit. He exhibits normal muscle tone. Coordination normal.  Skin: Skin is warm and dry. No rash noted. He is not diaphoretic. No erythema. No pallor.  Psychiatric: He has a normal mood and affect. His behavior is normal. Judgment and thought content normal.       Assessment:     Incarcerated periumbilical hernia at  site of prior appendectomy incision.     Plan:     Because this is gotten larger and is incarcerated, I recommended surgical repair.  Given his obesity and its size, I recommended mesh reinforcement with a laparoscopic approach.  Planned and vancomycin given his distant history of MRSA.  Not too large of a hernia, so hopefully just an outpatient surgery.  This would provide an opportunity for a core liver biopsy as well if there is evidence of obvious cirrhosis.  If it looks normal, I will hold  off.  He is interested in proceeding.  I discussed with him:  The anatomy & physiology of the abdominal wall was discussed.  The pathophysiology of hernias was discussed.  Natural history risks without surgery including progeressive enlargement, pain, incarceration & strangulation was discussed.   Contributors to complications such as smoking, obesity, diabetes, prior surgery, etc were discussed.   I feel the risks of no intervention will lead to serious problems that outweigh the operative risks; therefore, I recommended surgery to reduce and repair the  hernia.  I explained laparoscopic techniques with possible need for an open approach.  I noted the probable use of mesh to patch and/or buttress the hernia repair  Risks such as bleeding, infection, abscess, need for further treatment, heart attack, death, and other risks were discussed.  I noted a good likelihood this will help address the problem.   Goals of post-operative recovery were discussed as well.  Possibility that this will not correct all symptoms was explained.  I stressed the importance of low-impact activity, aggressive pain control, avoiding constipation, & not pushing through pain to minimize risk of post-operative chronic pain or injury. Possibility of reherniation especially with smoking, obesity, diabetes, immunosuppression, and other health conditions was discussed.  We will work to minimize complications.     An educational handout further  explaining the pathology & treatment options was given as well.  Questions were answered.  The patient expresses understanding & wishes to proceed with surgery.

## 2013-09-12 ENCOUNTER — Encounter (INDEPENDENT_AMBULATORY_CARE_PROVIDER_SITE_OTHER): Payer: Self-pay

## 2014-10-16 ENCOUNTER — Other Ambulatory Visit: Payer: Self-pay | Admitting: Surgery

## 2014-10-16 DIAGNOSIS — K7460 Unspecified cirrhosis of liver: Secondary | ICD-10-CM | POA: Insufficient documentation

## 2014-10-17 ENCOUNTER — Other Ambulatory Visit: Payer: Self-pay | Admitting: Surgery

## 2014-10-17 NOTE — H&P (Signed)
Daniel Nolan 10/16/2014 4:59 PM Location: [facility] Patient #: [id number] DOB: [birth year] Married / Language: English / Race: White Male History of Present Illness Daniel Hector MD; 10/17/2014 1:40 PM) The patient is a 54 year old male who presents with an incisional hernia. Patient returns a year status post initial consultation for incarcerated periumbilical incisional hernia. Pleasant male. History of hepatitis C. Concern for cirrhosis on prior liver biopsies and CT scans. Did have an MR that she seemed them showed improved a few years later. Has not seen a hepatologist for several years. Notice some swelling and pain near his bellybutton. Concern for hernia. I recommended surgical repair. Any anginal issues, he held off on surgery. However now it is more painful and bothersome to him. He is more willing to consider surgery now. Normally has bowel movement about every day. No bad fevers or sweats or nausea or vomiting. He did have an appendectomy done in 2007 but no other abdominal surgeries. Liver biopsy consistent with cirrhosis almost 10 years ago being followed by hepatology at [facility]. Dr. Patsy [place]. Perhaps now Dr. Maceo Pro. Again not seen for several years. Claims he can still hike a few miles without problems. History of MRSA infection in his hand almost a decade ago. Treated with incision and drainage. No new infections. Other Problems Illene Regulus, CMA; 10/17/2014 9:57 AM) Anxiety Disorder Hepatitis  Past Surgical History Illene Regulus, CMA; 10/17/2014 9:57 AM) Appendectomy  Diagnostic Studies History Lars Mage Spillers, CMA; 10/17/2014 9:57 AM) Colonoscopy 5-10 years ago  Allergies Elbert Ewings, CMA; 10/16/2014 5:00 PM) No Known Drug Allergies 10/16/2014  Medication History Elbert Ewings, CMA; 10/16/2014 5:00 PM) No Current Medications Medications Reconciled  Social History Illene Regulus, CMA; 10/17/2014 9:57 AM) Alcohol use Moderate  alcohol use. Caffeine use Coffee. Illicit drug use Remotely quit drug use. Tobacco use Former smoker.  Family History Illene Regulus, CMA; 10/17/2014 9:57 AM) Alcohol Abuse Brother, Father, Mother. Cerebrovascular Accident Mother. Heart disease in male family member before age 21     Review of Systems Illene Regulus CMA; 10/17/2014 9:57 AM) General Not Present- Appetite Loss, Chills, Fatigue, Fever, Night Sweats, Weight Gain and Weight Loss. Skin Not Present- Change in Wart/Mole, Dryness, Hives, Jaundice, New Lesions, Non-Healing Wounds, Rash and Ulcer. HEENT Not Present- Earache, Hearing Loss, Hoarseness, Nose Bleed, Oral Ulcers, Ringing in the Ears, Seasonal Allergies, Sinus Pain, Sore Throat, Visual Disturbances, Wears glasses/contact lenses and Yellow Eyes. Respiratory Not Present- Bloody sputum, Chronic Cough, Difficulty Breathing, Snoring and Wheezing. Breast Not Present- Breast Mass, Breast Pain, Nipple Discharge and Skin Changes. Cardiovascular Not Present- Chest Pain, Difficulty Breathing Lying Down, Leg Cramps, Palpitations, Rapid Heart Rate, Shortness of Breath and Swelling of Extremities. Gastrointestinal Not Present- Abdominal Pain, Bloating, Bloody Stool, Change in Bowel Habits, Chronic diarrhea, Constipation, Difficulty Swallowing, Excessive gas, Gets full quickly at meals, Hemorrhoids, Indigestion, Nausea, Rectal Pain and Vomiting. Male Genitourinary Not Present- Blood in Urine, Change in Urinary Stream, Frequency, Impotence, Nocturia, Painful Urination, Urgency and Urine Leakage. Musculoskeletal Present- Joint Pain and Swelling of Extremities. Not Present- Back Pain, Joint Stiffness, Muscle Pain and Muscle Weakness. Neurological Not Present- Decreased Memory, Fainting, Headaches, Numbness, Seizures, Tingling, Tremor, Trouble walking and Weakness. Psychiatric Present- Anxiety. Not Present- Bipolar, Change in Sleep Pattern, Depression, Fearful and Frequent  crying. Endocrine Not Present- Cold Intolerance, Excessive Hunger, Hair Changes, Heat Intolerance, Hot flashes and New Diabetes. Hematology Not Present- Easy Bruising, Excessive bleeding, Gland problems, HIV and Persistent Infections.  Vitals Elbert Ewings CMA; 10/16/2014 5:00 PM)  10/16/2014 5:00 PM Weight: 193 lb Height: 66in Body Surface Area: 2.02 m Body Mass Index: 31.15 kg/m Temp.: 97.70F(Oral)  Pulse: 64 (Regular)  BP: 130/70 (Sitting, Left Arm, Standard)     Physical Exam Daniel Hector MD; 10/16/2014 5:39 PM)  General Mental Status-Alert. General Appearance-Not in acute distress, Not Sickly. Orientation-Oriented X3. Hydration-Well hydrated. Voice-Normal.  Integumentary Global Assessment Upon inspection and palpation of skin surfaces of the - Axillae: non-tender, no inflammation or ulceration, no drainage. and Distribution of scalp and body hair is normal. General Characteristics Temperature - normal warmth is noted.  Head and Neck Head-normocephalic, atraumatic with no lesions or palpable masses. Face Global Assessment - atraumatic, no absence of expression. Neck Global Assessment - no abnormal movements, no bruit auscultated on the right, no bruit auscultated on the left, no decreased range of motion, non-tender. Trachea-midline. Thyroid Gland Characteristics - non-tender.  Eye Eyeball - Left-Extraocular movements intact, No Nystagmus. Eyeball - Right-Extraocular movements intact, No Nystagmus. Cornea - Left-No Hazy. Cornea - Right-No Hazy. Sclera/Conjunctiva - Left-No scleral icterus, No Discharge. Sclera/Conjunctiva - Right-No scleral icterus, No Discharge. Pupil - Left-Direct reaction to light normal. Pupil - Right-Direct reaction to light normal.  ENMT Ears Pinna - Left - no drainage observed, no generalized tenderness observed. Right - no drainage observed, no generalized tenderness observed. Nose and  Sinuses External Inspection of the Nose - no destructive lesion observed. Inspection of the nares - Left - quiet respiration. Right - quiet respiration. Mouth and Throat Lips - Upper Lip - no fissures observed, no pallor noted. Lower Lip - no fissures observed, no pallor noted. Nasopharynx - no discharge present. Oral Cavity/Oropharynx - Tongue - no dryness observed. Oral Mucosa - no cyanosis observed. Hypopharynx - no evidence of airway distress observed.  Chest and Lung Exam Inspection Movements - Normal and Symmetrical. Accessory muscles - No use of accessory muscles in breathing. Palpation Palpation of the chest reveals - Non-tender. Auscultation Breath sounds - Normal and Clear.  Cardiovascular Auscultation Rhythm - Regular. Murmurs & Other Heart Sounds - Auscultation of the heart reveals - No Murmurs and No Systolic Clicks.  Abdomen Inspection Inspection of the abdomen reveals - No Visible peristalsis and No Abnormal pulsations. Umbilicus - No Bleeding, No Urine drainage. Palpation/Percussion Palpation and Percussion of the abdomen reveal - Soft, Non Tender, No Rebound tenderness, No Rigidity (guarding) and No Cutaneous hyperesthesia. Note: Slightly overweight but soft. Mild diastases recti. Obvious hernia near her umbilicus with scar consistent with prior incision. Incarcerated. Not fully reducible. 4 cm mass.   Male Genitourinary Sexual Maturity Tanner 5 - Adult hair pattern and Adult penile size and shape. Note: No external male genitalia. Testes and epididymides cords normal. Sharp sensitivity LEFT lateral groin suspicious for small LEFT inguinal hernia. However not obvious. No RIGHT inguinal/groin hernia.   Peripheral Vascular Upper Extremity Inspection - Left - No Cyanotic nailbeds, Not Ischemic. Right - No Cyanotic nailbeds, Not Ischemic.  Neurologic Neurologic evaluation reveals -normal attention span and ability to concentrate, able to name objects and repeat  phrases. Appropriate fund of knowledge , normal sensation and normal coordination. Mental Status Affect - not angry, not paranoid. Cranial Nerves-Normal Bilaterally. Gait-Normal.  Neuropsychiatric Mental status exam performed with findings of-able to articulate well with normal speech/language, rate, volume and coherence, thought content normal with ability to perform basic computations and apply abstract reasoning and no evidence of hallucinations, delusions, obsessions or homicidal/suicidal ideation.  Musculoskeletal Global Assessment Spine, Ribs and Pelvis - no instability, subluxation or laxity.  Right Upper Extremity - no instability, subluxation or laxity.  Lymphatic Head & Neck  General Head & Neck Lymphatics: Bilateral - Description - No Localized lymphadenopathy. Axillary  General Axillary Region: Bilateral - Description - No Localized lymphadenopathy. Femoral & Inguinal  Generalized Femoral & Inguinal Lymphatics: Left - Description - No Localized lymphadenopathy. Right - Description - No Localized lymphadenopathy.    Assessment & Plan Daniel Hector MD; 10/16/2014 5:33 PM)  INCISIONAL HERNIA, WITHOUT OBSTRUCTION OR GANGRENE (553.21  K43.2) Impression: Periumbilical incisional hernia from prior laparoscopic appendectomy. Now larger and more symptomatic.  Again think he would benefit from repair. His heavy physical needs and possible cirrhosis, would definitely recommend mesh underlay repair. Reasonable to laparoscopic approach. That'll allow the possibility of liver biopsy to rule out cirrhosis. Also make sure he does not have a LEFT inguinal hernia since he was quite sensitive there. If there is hernia there, would plan to repair at the same time. He is more motivated to consider surgery and is hopeful his co-pay will be as steep this time.  I cautioned him against going back to work too soon. 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 We will see how he is doing the first postoperative visit and adjust. Maybe he'll be able to come back earlier to do some light duty if his supervisor agrees.  Current Plans Schedule for Surgery Written instructions provided Pt Education - Pamphlet Given - Laparoscopic Hernia Repair: discussed with patient and provided information. The anatomy & physiology of the abdominal wall and pelvic floor was discussed. The pathophysiology of hernias in the inguinal and pelvic region was discussed. Natural history risks such as progressive enlargement, pain, incarceration, and strangulation was discussed. Contributors to complications such as smoking, obesity, diabetes, prior surgery, etc were discussed.  I feel the risks of no intervention will lead to serious problems that outweigh the operative risks; therefore, I recommended surgery to reduce and repair the hernia. I explained laparoscopic techniques with possible need for an open approach. I noted usual use of mesh to patch and/or buttress hernia repair  Risks such as bleeding, infection, abscess, need for further treatment, heart attack, death, and other risks were discussed. I noted a good likelihood this will help address the problem. Goals of post-operative recovery were discussed as well. Possibility that this will not correct all symptoms was explained. I stressed the importance of low-impact activity, aggressive pain control, avoiding constipation, & not pushing through pain to minimize risk of post-operative chronic pain or injury. Possibility of reherniation was discussed. We will work to minimize complications.  An educational handout further explaining the pathology & treatment options was given as well. Questions were answered. The patient expresses understanding & wishes to proceed with surgery. Pt Education - [facility] Hernia Post-Op HCI (Aerica Rincon): discussed with patient and provided information. Pt  Education - [facility] Pain Control (Khamani Fairley)  Daniel Nolan, M.D., F.A.C.S. Gastrointestinal and Minimally Invasive Surgery [facility] [address] [place], [place] [id number] [phone] Main / Paging

## 2015-04-09 ENCOUNTER — Encounter (HOSPITAL_COMMUNITY): Payer: Self-pay

## 2015-04-09 ENCOUNTER — Other Ambulatory Visit: Payer: Self-pay | Admitting: Surgery

## 2015-04-09 ENCOUNTER — Encounter (HOSPITAL_COMMUNITY)
Admission: RE | Admit: 2015-04-09 | Discharge: 2015-04-09 | Disposition: A | Discharge status: Home / Self Care | Payer: Managed Care, Other (non HMO) | Source: Ambulatory Visit | Attending: Surgery | Admitting: Surgery

## 2015-04-09 ENCOUNTER — Encounter (INDEPENDENT_AMBULATORY_CARE_PROVIDER_SITE_OTHER): Payer: Self-pay

## 2015-04-09 DIAGNOSIS — K7460 Unspecified cirrhosis of liver: Secondary | ICD-10-CM | POA: Diagnosis not present

## 2015-04-09 DIAGNOSIS — Z01812 Encounter for preprocedural laboratory examination: Secondary | ICD-10-CM | POA: Diagnosis not present

## 2015-04-09 DIAGNOSIS — K436 Other and unspecified ventral hernia with obstruction, without gangrene: Secondary | ICD-10-CM | POA: Insufficient documentation

## 2015-04-09 HISTORY — DX: Adverse effect of unspecified anesthetic, initial encounter: T41.45XA

## 2015-04-09 HISTORY — DX: Anxiety disorder, unspecified: F41.9

## 2015-04-09 HISTORY — DX: Presence of spectacles and contact lenses: Z97.3

## 2015-04-09 HISTORY — DX: Other complications of anesthesia, initial encounter: T88.59XA

## 2015-04-09 HISTORY — DX: Personal history of urinary calculi: Z87.442

## 2015-04-09 LAB — COMPREHENSIVE METABOLIC PANEL
ALK PHOS: 71 U/L (ref 38–126)
ALT: 27 U/L (ref 17–63)
ANION GAP: 8 (ref 5–15)
AST: 27 U/L (ref 15–41)
Albumin: 4.9 g/dL (ref 3.5–5.0)
BILIRUBIN TOTAL: 0.8 mg/dL (ref 0.3–1.2)
BUN: 14 mg/dL (ref 6–20)
CALCIUM: 9.9 mg/dL (ref 8.9–10.3)
CO2: 27 mmol/L (ref 22–32)
Chloride: 105 mmol/L (ref 101–111)
Creatinine, Ser: 0.9 mg/dL (ref 0.61–1.24)
GLUCOSE: 96 mg/dL (ref 65–99)
Potassium: 4.4 mmol/L (ref 3.5–5.1)
Sodium: 140 mmol/L (ref 135–145)
TOTAL PROTEIN: 8.1 g/dL (ref 6.5–8.1)

## 2015-04-09 LAB — SURGICAL PCR SCREEN
MRSA, PCR: NEGATIVE
Staphylococcus aureus: POSITIVE — AB

## 2015-04-09 LAB — CBC
HCT: 42.6 % (ref 39.0–52.0)
HEMOGLOBIN: 14.4 g/dL (ref 13.0–17.0)
MCH: 31.4 pg (ref 26.0–34.0)
MCHC: 33.8 g/dL (ref 30.0–36.0)
MCV: 93 fL (ref 78.0–100.0)
Platelets: 185 10*3/uL (ref 150–400)
RBC: 4.58 MIL/uL (ref 4.22–5.81)
RDW: 12.3 % (ref 11.5–15.5)
WBC: 4.6 10*3/uL (ref 4.0–10.5)

## 2015-04-09 NOTE — Patient Instructions (Signed)
ISADORE LABAT  04/09/2015   Your procedure is scheduled on: Wednesday April 17, 2015   Report to [facility] Main  Entrance take [place]  elevators to 3rd floor to  [place] at 7:00 AM.  Call this number if you have problems the morning of surgery [phone]   Remember: ONLY 1 PERSON MAY GO WITH YOU TO SHORT STAY TO GET  READY MORNING OF [place].  Do not eat food or drink liquids :After Midnight.     Take these medicines the morning of surgery with A SIP OF WATER: NONE                               You may not have any metal on your body including hair pins and              piercings  Do not wear jewelry,  lotions, powders or colognes, deodorant                           Men may shave face and neck.   Do not bring valuables to the hospital. [place].  Contacts, dentures or bridgework may not be worn into surgery.  Leave suitcase in the car. After surgery it may be brought to your room.                Please read over the following fact sheets you were given:MRSA INFORMATION SHEET _____________________________________________________________________             [facility] - Preparing for Surgery Before surgery, you can play an important role.  Because skin is not sterile, your skin needs to be as free of germs as possible.  You can reduce the number of germs on your skin by washing with CHG (chlorahexidine gluconate) soap before surgery.  CHG is an antiseptic cleaner which kills germs and bonds with the skin to continue killing germs even after washing. Please DO NOT use if you have an allergy to CHG or antibacterial soaps.  If your skin becomes reddened/irritated stop using the CHG and inform your nurse when you arrive at Short Stay. Do not shave (including legs and underarms) for at least 48 hours prior to the first CHG shower.  You may shave your face/neck. Please follow these  instructions carefully:  1.  Shower with CHG Soap the night before surgery and the  morning of Surgery.  2.  If you choose to wash your hair, wash your hair first as usual with your  normal  shampoo.  3.  After you shampoo, rinse your hair and body thoroughly to remove the  shampoo.                           4.  Use CHG as you would any other liquid soap.  You can apply chg directly  to the skin and wash                       Gently with a scrungie or clean washcloth.  5.  Apply the CHG Soap to your body ONLY FROM THE NECK DOWN.   Do  not use on face/ open                           Wound or open sores. Avoid contact with eyes, ears mouth and genitals (private parts).                       Wash face,  Genitals (private parts) with your normal soap.             6.  Wash thoroughly, paying special attention to the area where your surgery  will be performed.  7.  Thoroughly rinse your body with warm water from the neck down.  8.  DO NOT shower/wash with your normal soap after using and rinsing off  the CHG Soap.                9.  Pat yourself dry with a clean towel.            10.  Wear clean pajamas.            11.  Place clean sheets on your bed the night of your first shower and do not  sleep with pets. Day of Surgery : Do not apply any lotions/deodorants the morning of surgery.  Please wear clean clothes to the hospital/surgery center.  FAILURE TO FOLLOW THESE INSTRUCTIONS MAY RESULT IN THE CANCELLATION OF YOUR SURGERY PATIENT SIGNATURE_________________________________  NURSE SIGNATURE__________________________________  ________________________________________________________________________

## 2015-04-09 NOTE — H&P (Signed)
Daniel Nolan 10/16/2014 4:59 PM Location: [facility] Patient #: [id number] DOB: Oct 30, 1960 Married / Language: English / Race: White Male   History of Present Illness Daniel Hector MD; 10/17/2014 1:40 PM) The patient is a 55 year old male who presents with an incisional hernia. Patient returns a year status post initial consultation for incarcerated periumbilical incisional hernia. Pleasant male. History of hepatitis C. Concern for cirrhosis on prior liver biopsies and CT scans. Did have an MR that she seemed them showed improved a few years later. Has not seen a hepatologist for several years. Notice some swelling and pain near his bellybutton. Concern for hernia. I recommended surgical repair. Any anginal issues, he held off on surgery. However now it is more painful and bothersome to him. He is more willing to consider surgery now. Normally has bowel movement about every day. No bad fevers or sweats or nausea or vomiting. He did have an appendectomy done in 2007 but no other abdominal surgeries. Liver biopsy consistent with cirrhosis almost 10 years ago being followed by hepatology at [facility]. Dr. Patsy Nolan. Perhaps now Dr. Maceo Nolan. Again not seen for several years. Claims he can still hike a few miles without problems. History of MRSA infection in his hand almost a decade ago. Treated with incision and drainage. No new infections.   Other Problems Daniel Nolan, CMA; 10/17/2014 9:57 AM) Anxiety Disorder Hepatitis  Past Surgical History Daniel Nolan, CMA; 10/17/2014 9:57 AM) Appendectomy  Diagnostic Studies History Daniel Nolan, CMA; 10/17/2014 9:57 AM) Colonoscopy 5-10 years ago  Allergies Daniel Nolan, CMA; 10/16/2014 5:00 PM) No Known Drug Allergies07/26/2016  Medication History Daniel Nolan, CMA; 10/16/2014 5:00 PM) No Current Medications Medications Reconciled  Social History Daniel Nolan, CMA; 10/17/2014 9:57 AM) Alcohol use  Moderate alcohol use. Caffeine use Coffee. Illicit drug use Remotely quit drug use. Tobacco use Former smoker.  Family History Daniel Nolan, CMA; 10/17/2014 9:57 AM) Alcohol Abuse Brother, Father, Mother. Cerebrovascular Accident Mother. Heart disease in male family member before age 42    Review of Systems Daniel Nolan CMA; 10/17/2014 9:57 AM) General Not Present- Appetite Loss, Chills, Fatigue, Fever, Night Sweats, Weight Gain and Weight Loss. Skin Not Present- Change in Wart/Mole, Dryness, Hives, Jaundice, New Lesions, Non-Healing Wounds, Rash and Ulcer. HEENT Not Present- Earache, Hearing Loss, Hoarseness, Nose Bleed, Oral Ulcers, Ringing in the Ears, Seasonal Allergies, Sinus Pain, Sore Throat, Visual Disturbances, Wears glasses/contact lenses and Yellow Eyes. Respiratory Not Present- Bloody sputum, Chronic Cough, Difficulty Breathing, Snoring and Wheezing. Breast Not Present- Breast Mass, Breast Pain, Nipple Discharge and Skin Changes. Cardiovascular Not Present- Chest Pain, Difficulty Breathing Lying Down, Leg Cramps, Palpitations, Rapid Heart Rate, Shortness of Breath and Swelling of Extremities. Gastrointestinal Not Present- Abdominal Pain, Bloating, Bloody Stool, Change in Bowel Habits, Chronic diarrhea, Constipation, Difficulty Swallowing, Excessive gas, Gets full quickly at meals, Hemorrhoids, Indigestion, Nausea, Rectal Pain and Vomiting. Male Genitourinary Not Present- Blood in Urine, Change in Urinary Stream, Frequency, Impotence, Nocturia, Painful Urination, Urgency and Urine Leakage. Musculoskeletal Present- Joint Pain and Swelling of Extremities. Not Present- Back Pain, Joint Stiffness, Muscle Pain and Muscle Weakness. Neurological Not Present- Decreased Memory, Fainting, Headaches, Numbness, Seizures, Tingling, Tremor, Trouble walking and Weakness. Psychiatric Present- Anxiety. Not Present- Bipolar, Change in Sleep Pattern, Depression, Fearful and Frequent  crying. Endocrine Not Present- Cold Intolerance, Excessive Hunger, Hair Changes, Heat Intolerance, Hot flashes and New Diabetes. Hematology Not Present- Easy Bruising, Excessive bleeding, Gland problems, HIV and Persistent Infections.  Vitals Daniel Nolan CMA; 10/16/2014  5:00 PM) 10/16/2014 5:00 PM Weight: 193 lb Height: 66in Body Surface Area: 2.02 m Body Mass Index: 31.15 kg/m  Temp.: 97.55F(Oral)  Pulse: 64 (Regular)  BP: 130/70 (Sitting, Left Arm, Standard)     Physical Exam Daniel Hector MD; 10/16/2014 5:39 PM) General Mental Status-Alert. General Appearance-Not in acute distress, Not Sickly. Orientation-Oriented X3. Hydration-Well hydrated. Voice-Normal.  Integumentary Global Assessment Upon inspection and palpation of skin surfaces of the - Axillae: non-tender, no inflammation or ulceration, no drainage. and Distribution of scalp and body hair is normal. General Characteristics Temperature - normal warmth is noted.  Head and Neck Head-normocephalic, atraumatic with no lesions or palpable masses. Face Global Assessment - atraumatic, no absence of expression. Neck Global Assessment - no abnormal movements, no bruit auscultated on the right, no bruit auscultated on the left, no decreased range of motion, non-tender. Trachea-midline. Thyroid Gland Characteristics - non-tender.  Eye Eyeball - Left-Extraocular movements intact, No Nystagmus. Eyeball - Right-Extraocular movements intact, No Nystagmus. Cornea - Left-No Hazy. Cornea - Right-No Hazy. Sclera/Conjunctiva - Left-No scleral icterus, No Discharge. Sclera/Conjunctiva - Right-No scleral icterus, No Discharge. Pupil - Left-Direct reaction to light normal. Pupil - Right-Direct reaction to light normal.  ENMT Ears Pinna - Left - no drainage observed, no generalized tenderness observed. Right - no drainage observed, no generalized tenderness observed. Nose and  Sinuses External Inspection of the Nose - no destructive lesion observed. Inspection of the nares - Left - quiet respiration. Right - quiet respiration. Mouth and Throat Lips - Upper Lip - no fissures observed, no pallor noted. Lower Lip - no fissures observed, no pallor noted. Nasopharynx - no discharge present. Oral Cavity/Oropharynx - Tongue - no dryness observed. Oral Mucosa - no cyanosis observed. Hypopharynx - no evidence of airway distress observed.  Chest and Lung Exam Inspection Movements - Normal and Symmetrical. Accessory muscles - No use of accessory muscles in breathing. Palpation Palpation of the chest reveals - Non-tender. Auscultation Breath sounds - Normal and Clear.  Cardiovascular Auscultation Rhythm - Regular. Murmurs & Other Heart Sounds - Auscultation of the heart reveals - No Murmurs and No Systolic Clicks.  Abdomen Inspection Inspection of the abdomen reveals - No Visible peristalsis and No Abnormal pulsations. Umbilicus - No Bleeding, No Urine drainage. Palpation/Percussion Palpation and Percussion of the abdomen reveal - Soft, Non Tender, No Rebound tenderness, No Rigidity (guarding) and No Cutaneous hyperesthesia. Note: Slightly overweight but soft. Mild diastases recti. Obvious hernia near her umbilicus with scar consistent with prior incision. Incarcerated. Not fully reducible. 4 cm mass.   Male Genitourinary Sexual Maturity Tanner 5 - Adult hair pattern and Adult penile size and shape. Note: No external male genitalia. Testes and epididymides cords normal. Sharp sensitivity LEFT lateral groin suspicious for small LEFT inguinal hernia. However not obvious. No RIGHT inguinal/groin hernia.   Peripheral Vascular Upper Extremity Inspection - Left - No Cyanotic nailbeds, Not Ischemic. Right - No Cyanotic nailbeds, Not Ischemic.  Neurologic Neurologic evaluation reveals -normal attention span and ability to concentrate, able to name objects and repeat  phrases. Appropriate fund of knowledge , normal sensation and normal coordination. Mental Status Affect - not angry, not paranoid. Cranial Nerves-Normal Bilaterally. Gait-Normal.  Neuropsychiatric Mental status exam performed with findings of-able to articulate well with normal speech/language, rate, volume and coherence, thought content normal with ability to perform basic computations and apply abstract reasoning and no evidence of hallucinations, delusions, obsessions or homicidal/suicidal ideation.  Musculoskeletal Global Assessment Spine, Ribs and Pelvis - no instability, subluxation  or laxity. Right Upper Extremity - no instability, subluxation or laxity.  Lymphatic Head & Neck  General Head & Neck Lymphatics: Bilateral - Description - No Localized lymphadenopathy. Axillary  General Axillary Region: Bilateral - Description - No Localized lymphadenopathy. Femoral & Inguinal  Generalized Femoral & Inguinal Lymphatics: Left - Description - No Localized lymphadenopathy. Right - Description - No Localized lymphadenopathy.    Assessment & Plan ( INCISIONAL HERNIA, WITHOUT OBSTRUCTION OR GANGRENE (553.21  K43.2) Impression: Periumbilical incisional hernia from prior laparoscopic appendectomy. Now larger and more symptomatic.  Again think he would benefit from repair. His heavy physical needs and possible cirrhosis, would definitely recommend mesh underlay repair. Reasonable to laparoscopic approach. That'll allow the possibility of liver biopsy to rule out cirrhosis. Also make sure he does not have a LEFT inguinal hernia since he was quite sensitive there. If there is hernia there, would plan to repair at the same time. He is more motivated to consider surgery and is hopeful his co-pay will be as steep this time.  I cautioned him against going back to work too soon. 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 We will see how he is doing the first postoperative visit and adjust. Maybe he'll be able to come back earlier to do some light duty if his supervisor agrees.  Patient his ready to consider surgery now.   Current Plans Schedule for Surgery  Written instructions provided  Pt Education - Pamphlet Given - Laparoscopic Hernia Repair: discussed with patient and provided information. The anatomy & physiology of the abdominal wall and pelvic floor was discussed. The pathophysiology of hernias in the inguinal and pelvic region was discussed. Natural history risks such as progressive enlargement, pain, incarceration, and strangulation was discussed. Contributors to complications such as smoking, obesity, diabetes, prior surgery, etc were discussed.  I feel the risks of no intervention will lead to serious problems that outweigh the operative risks; therefore, I recommended surgery to reduce and repair the hernia. I explained laparoscopic techniques with possible need for an open approach. I noted usual use of mesh to patch and/or buttress hernia repair  Risks such as bleeding, infection, abscess, need for further treatment, heart attack, death, and other risks were discussed. I noted a good likelihood this will help address the problem. Goals of post-operative recovery were discussed as well. Possibility that this will not correct all symptoms was explained. I stressed the importance of low-impact activity, aggressive pain control, avoiding constipation, & not pushing through pain to minimize risk of post-operative chronic pain or injury. Possibility of reherniation was discussed. We will work to minimize complications.  An educational handout further explaining the pathology & treatment options was given as well. Questions were answered. The patient expresses understanding & wishes to proceed with surgery.  Pt Education - [facility] Hernia Post-Op HCI (Shantele Reller): discussed with patient and provided  information. Pt Education - [facility] Pain Control (Hong Moring)  Daniel Nolan, M.D., F.A.C.S. Gastrointestinal and Minimally Invasive Surgery [facility] [address], [place], [place] [address] [phone] Main / Paging

## 2015-04-09 NOTE — Progress Notes (Signed)
Surgical screening results in epic per PAT visit 04/09/2015 positive for STAPH. Results sent to Dr Johney Maine. Prescription for Mupriocin Ointment called to [facility] - spoke with Tammy/pharmacist. Pt is aware.

## 2015-04-17 ENCOUNTER — Encounter (HOSPITAL_COMMUNITY): Payer: Self-pay | Admitting: Certified Registered Nurse Anesthetist

## 2015-04-17 ENCOUNTER — Ambulatory Visit (HOSPITAL_COMMUNITY): Payer: Managed Care, Other (non HMO) | Admitting: Certified Registered Nurse Anesthetist

## 2015-04-17 ENCOUNTER — Ambulatory Visit (HOSPITAL_COMMUNITY)
Admission: RE | Admit: 2015-04-17 | Discharge: 2015-04-17 | Disposition: A | Discharge status: Home / Self Care | Payer: Managed Care, Other (non HMO) | Source: Ambulatory Visit | Attending: Surgery | Admitting: Surgery

## 2015-04-17 ENCOUNTER — Encounter (HOSPITAL_COMMUNITY)
Admission: RE | Disposition: A | Discharge status: Home / Self Care | Payer: Self-pay | Source: Ambulatory Visit | Attending: Surgery

## 2015-04-17 DIAGNOSIS — B1920 Unspecified viral hepatitis C without hepatic coma: Secondary | ICD-10-CM | POA: Diagnosis not present

## 2015-04-17 DIAGNOSIS — Z9049 Acquired absence of other specified parts of digestive tract: Secondary | ICD-10-CM | POA: Insufficient documentation

## 2015-04-17 DIAGNOSIS — Z8614 Personal history of Methicillin resistant Staphylococcus aureus infection: Secondary | ICD-10-CM | POA: Insufficient documentation

## 2015-04-17 DIAGNOSIS — M1990 Unspecified osteoarthritis, unspecified site: Secondary | ICD-10-CM | POA: Insufficient documentation

## 2015-04-17 DIAGNOSIS — K4020 Bilateral inguinal hernia, without obstruction or gangrene, not specified as recurrent: Secondary | ICD-10-CM | POA: Insufficient documentation

## 2015-04-17 DIAGNOSIS — Z87891 Personal history of nicotine dependence: Secondary | ICD-10-CM | POA: Diagnosis not present

## 2015-04-17 DIAGNOSIS — D176 Benign lipomatous neoplasm of spermatic cord: Secondary | ICD-10-CM | POA: Insufficient documentation

## 2015-04-17 DIAGNOSIS — K430 Incisional hernia with obstruction, without gangrene: Secondary | ICD-10-CM | POA: Diagnosis present

## 2015-04-17 DIAGNOSIS — K7460 Unspecified cirrhosis of liver: Secondary | ICD-10-CM | POA: Insufficient documentation

## 2015-04-17 DIAGNOSIS — K4190 Unilateral femoral hernia, without obstruction or gangrene, not specified as recurrent: Secondary | ICD-10-CM | POA: Insufficient documentation

## 2015-04-17 HISTORY — PX: INSERTION OF MESH: SHX5868

## 2015-04-17 HISTORY — PX: VENTRAL HERNIA REPAIR: SHX424

## 2015-04-17 HISTORY — PX: LIVER BIOPSY: SHX301

## 2015-04-17 HISTORY — PX: INGUINAL HERNIA REPAIR: SHX194

## 2015-04-17 SURGERY — REPAIR, HERNIA, VENTRAL, LAPAROSCOPIC
Anesthesia: General | Site: Groin

## 2015-04-17 MED ORDER — LABETALOL HCL 5 MG/ML IV SOLN
5.0000 mg | INTRAVENOUS | Status: DC | PRN
  Administered 2015-04-17: 5 mg via INTRAVENOUS

## 2015-04-17 MED ORDER — METOPROLOL TARTRATE 12.5 MG HALF TABLET
12.5000 mg | ORAL_TABLET | Freq: Two times a day (BID) | ORAL | Status: DC | PRN
  Filled 2015-04-17: qty 1

## 2015-04-17 MED ORDER — KETOROLAC TROMETHAMINE 30 MG/ML IJ SOLN
INTRAMUSCULAR | Status: AC
  Filled 2015-04-17: qty 1

## 2015-04-17 MED ORDER — METHOCARBAMOL 1000 MG/10ML IJ SOLN
1000.0000 mg | Freq: Four times a day (QID) | INTRAVENOUS | Status: DC | PRN
  Administered 2015-04-17: 1000 mg via INTRAVENOUS
  Filled 2015-04-17 (×2): qty 10

## 2015-04-17 MED ORDER — NAPROXEN 500 MG PO TABS: 500.0000 mg | ORAL_TABLET | Freq: Two times a day (BID) | ORAL | Status: AC

## 2015-04-17 MED ORDER — SODIUM CHLORIDE 0.9 % IJ SOLN
INTRAMUSCULAR | Status: AC
  Filled 2015-04-17: qty 100

## 2015-04-17 MED ORDER — ONDANSETRON HCL 4 MG/2ML IJ SOLN
INTRAMUSCULAR | Status: AC
  Filled 2015-04-17: qty 2

## 2015-04-17 MED ORDER — DIPHENHYDRAMINE HCL 50 MG/ML IJ SOLN: 12.5000 mg | Freq: Four times a day (QID) | INTRAMUSCULAR | Status: DC | PRN

## 2015-04-17 MED ORDER — LACTATED RINGERS IV SOLN: INTRAVENOUS | Status: DC

## 2015-04-17 MED ORDER — PHENYLEPHRINE HCL 10 MG/ML IJ SOLN
INTRAMUSCULAR | Status: DC | PRN
  Administered 2015-04-17: 80 ug via INTRAVENOUS

## 2015-04-17 MED ORDER — NEOSTIGMINE METHYLSULFATE 10 MG/10ML IV SOLN
INTRAVENOUS | Status: DC | PRN
  Administered 2015-04-17: 5 mg via INTRAVENOUS

## 2015-04-17 MED ORDER — 0.9 % SODIUM CHLORIDE (POUR BTL) OPTIME
TOPICAL | Status: DC | PRN
  Administered 2015-04-17: 1000 mL

## 2015-04-17 MED ORDER — LACTATED RINGERS IV SOLN
INTRAVENOUS | Status: DC
  Administered 2015-04-17 (×2): via INTRAVENOUS
  Administered 2015-04-17: 1000 mL via INTRAVENOUS

## 2015-04-17 MED ORDER — STERILE WATER FOR IRRIGATION IR SOLN
Status: DC | PRN
  Administered 2015-04-17: 3000 mL

## 2015-04-17 MED ORDER — PROMETHAZINE HCL 25 MG/ML IJ SOLN
6.2500 mg | INTRAMUSCULAR | Status: DC | PRN
  Administered 2015-04-17: 6.25 mg via INTRAVENOUS
  Filled 2015-04-17: qty 1

## 2015-04-17 MED ORDER — GLYCOPYRROLATE 0.2 MG/ML IJ SOLN
INTRAMUSCULAR | Status: DC | PRN
  Administered 2015-04-17: .8 mg via INTRAVENOUS

## 2015-04-17 MED ORDER — PROPOFOL 10 MG/ML IV BOLUS
INTRAVENOUS | Status: AC
  Filled 2015-04-17: qty 40

## 2015-04-17 MED ORDER — SUCCINYLCHOLINE CHLORIDE 20 MG/ML IJ SOLN
INTRAMUSCULAR | Status: DC | PRN
  Administered 2015-04-17: 100 mg via INTRAVENOUS

## 2015-04-17 MED ORDER — NAPROXEN 500 MG PO TABS
500.0000 mg | ORAL_TABLET | Freq: Two times a day (BID) | ORAL | Status: DC
  Filled 2015-04-17 (×3): qty 1

## 2015-04-17 MED ORDER — BUPIVACAINE-EPINEPHRINE 0.25% -1:200000 IJ SOLN
INTRAMUSCULAR | Status: DC | PRN
  Administered 2015-04-17: 92 mL

## 2015-04-17 MED ORDER — ROCURONIUM BROMIDE 100 MG/10ML IV SOLN
INTRAVENOUS | Status: DC | PRN
  Administered 2015-04-17: 50 mg via INTRAVENOUS
  Administered 2015-04-17: 10 mg via INTRAVENOUS
  Administered 2015-04-17 (×2): 20 mg via INTRAVENOUS

## 2015-04-17 MED ORDER — ROCURONIUM BROMIDE 100 MG/10ML IV SOLN
INTRAVENOUS | Status: AC
  Filled 2015-04-17: qty 1

## 2015-04-17 MED ORDER — METHOCARBAMOL 500 MG PO TABS
1000.0000 mg | ORAL_TABLET | Freq: Four times a day (QID) | ORAL | Status: DC | PRN
Start: 2015-04-17 — End: 2015-04-17

## 2015-04-17 MED ORDER — KETOROLAC TROMETHAMINE 30 MG/ML IJ SOLN
INTRAMUSCULAR | Status: DC | PRN
  Administered 2015-04-17: 30 mg via INTRAVENOUS

## 2015-04-17 MED ORDER — BUPIVACAINE-EPINEPHRINE 0.25% -1:200000 IJ SOLN
INTRAMUSCULAR | Status: AC
  Filled 2015-04-17: qty 2

## 2015-04-17 MED ORDER — LABETALOL HCL 5 MG/ML IV SOLN
INTRAVENOUS | Status: AC
  Filled 2015-04-17: qty 4

## 2015-04-17 MED ORDER — MEPERIDINE HCL 50 MG/ML IJ SOLN: 6.2500 mg | INTRAMUSCULAR | Status: DC | PRN

## 2015-04-17 MED ORDER — FENTANYL CITRATE (PF) 100 MCG/2ML IJ SOLN
INTRAMUSCULAR | Status: DC | PRN
  Administered 2015-04-17: 100 ug via INTRAVENOUS
  Administered 2015-04-17: 50 ug via INTRAVENOUS
  Administered 2015-04-17: 100 ug via INTRAVENOUS

## 2015-04-17 MED ORDER — MIDAZOLAM HCL 2 MG/2ML IJ SOLN
INTRAMUSCULAR | Status: AC
  Filled 2015-04-17: qty 2

## 2015-04-17 MED ORDER — HYDROMORPHONE HCL 1 MG/ML IJ SOLN
0.2500 mg | INTRAMUSCULAR | Status: DC | PRN
  Administered 2015-04-17 (×2): 0.25 mg via INTRAVENOUS

## 2015-04-17 MED ORDER — LIDOCAINE HCL (CARDIAC) 20 MG/ML IV SOLN
INTRAVENOUS | Status: DC | PRN
  Administered 2015-04-17: 50 mg via INTRAVENOUS

## 2015-04-17 MED ORDER — OXYCODONE HCL 5 MG PO TABS: 5.0000 mg | ORAL_TABLET | ORAL | Status: DC | PRN

## 2015-04-17 MED ORDER — BUPIVACAINE LIPOSOME 1.3 % IJ SUSP
20.0000 mL | INTRAMUSCULAR | Status: DC
  Filled 2015-04-17: qty 20

## 2015-04-17 MED ORDER — HYDROMORPHONE HCL 1 MG/ML IJ SOLN
INTRAMUSCULAR | Status: AC
  Filled 2015-04-17: qty 1

## 2015-04-17 MED ORDER — MIDAZOLAM HCL 5 MG/5ML IJ SOLN
INTRAMUSCULAR | Status: DC | PRN
  Administered 2015-04-17: 2 mg via INTRAVENOUS

## 2015-04-17 MED ORDER — METOPROLOL TARTRATE 1 MG/ML IV SOLN: 5.0000 mg | Freq: Four times a day (QID) | INTRAVENOUS | Status: DC | PRN

## 2015-04-17 MED ORDER — CEFAZOLIN SODIUM-DEXTROSE 2-3 GM-% IV SOLR
INTRAVENOUS | Status: AC
  Filled 2015-04-17: qty 50

## 2015-04-17 MED ORDER — ACETAMINOPHEN 325 MG PO TABS: 325.0000 mg | ORAL_TABLET | Freq: Four times a day (QID) | ORAL | Status: DC | PRN

## 2015-04-17 MED ORDER — HYDROMORPHONE HCL 1 MG/ML IJ SOLN: 0.5000 mg | INTRAMUSCULAR | Status: DC | PRN

## 2015-04-17 MED ORDER — BUPIVACAINE LIPOSOME 1.3 % IJ SUSP
INTRAMUSCULAR | Status: DC | PRN
  Administered 2015-04-17: 20 mL

## 2015-04-17 MED ORDER — LIDOCAINE HCL (CARDIAC) 20 MG/ML IV SOLN
INTRAVENOUS | Status: AC
  Filled 2015-04-17: qty 5

## 2015-04-17 MED ORDER — OXYCODONE HCL 5 MG PO TABS
5.0000 mg | ORAL_TABLET | ORAL | Status: DC | PRN
  Administered 2015-04-17: 5 mg via ORAL
  Filled 2015-04-17: qty 1

## 2015-04-17 MED ORDER — ONDANSETRON HCL 4 MG/2ML IJ SOLN
INTRAMUSCULAR | Status: DC | PRN
  Administered 2015-04-17: 4 mg via INTRAVENOUS

## 2015-04-17 MED ORDER — SODIUM CHLORIDE 0.9 % IJ SOLN
INTRAMUSCULAR | Status: DC | PRN
  Administered 2015-04-17: 80 mL via INTRAVENOUS

## 2015-04-17 MED ORDER — FENTANYL CITRATE (PF) 250 MCG/5ML IJ SOLN
INTRAMUSCULAR | Status: AC
  Filled 2015-04-17: qty 5

## 2015-04-17 MED ORDER — CEFAZOLIN SODIUM-DEXTROSE 2-3 GM-% IV SOLR
2.0000 g | INTRAVENOUS | Status: AC
  Administered 2015-04-17: 2 g via INTRAVENOUS

## 2015-04-17 MED ORDER — PROPOFOL 10 MG/ML IV BOLUS
INTRAVENOUS | Status: DC | PRN
  Administered 2015-04-17: 180 mg via INTRAVENOUS

## 2015-04-17 MED FILL — oxyCODONE HCL 5 MG TABS: 5 | 4 days supply | Qty: 40 | Fill #0

## 2015-04-17 MED FILL — NAPROXEN 500 MG TABLET: 500 | 20 days supply | Qty: 40 | Fill #0

## 2015-04-17 SURGICAL SUPPLY — 43 items
APPLIER CLIP 5 13 M/L LIGAMAX5 (MISCELLANEOUS)
BINDER ABDOMINAL 12 ML 46-62 (SOFTGOODS) ×6 IMPLANT
CABLE HIGH FREQUENCY MONO STRZ (ELECTRODE) ×6 IMPLANT
CHLORAPREP W/TINT 26ML (MISCELLANEOUS) ×6 IMPLANT
CLIP APPLIE 5 13 M/L LIGAMAX5 (MISCELLANEOUS) IMPLANT
CLOSURE WOUND 1/2 X4 (GAUZE/BANDAGES/DRESSINGS) ×1
COVER SURGICAL LIGHT HANDLE (MISCELLANEOUS) IMPLANT
DECANTER SPIKE VIAL GLASS SM (MISCELLANEOUS) ×12 IMPLANT
DEVICE SECURE STRAP 25 ABSORB (INSTRUMENTS) ×6 IMPLANT
DEVICE TROCAR PUNCTURE CLOSURE (ENDOMECHANICALS) ×6 IMPLANT
DRAPE LAPAROSCOPIC ABDOMINAL (DRAPES) IMPLANT
DRAPE WARM FLUID 44X44 (DRAPE) ×6 IMPLANT
DRSG TEGADERM 2-3/8X2-3/4 SM (GAUZE/BANDAGES/DRESSINGS) ×6 IMPLANT
DRSG TEGADERM 4X4.75 (GAUZE/BANDAGES/DRESSINGS) ×6 IMPLANT
ELECT REM PT RETURN 9FT ADLT (ELECTROSURGICAL) ×6
ELECTRODE REM PT RTRN 9FT ADLT (ELECTROSURGICAL) ×4 IMPLANT
GAUZE SPONGE 2X2 8PLY STRL LF (GAUZE/BANDAGES/DRESSINGS) ×4 IMPLANT
GLOVE ECLIPSE 8.0 STRL XLNG CF (GLOVE) ×6 IMPLANT
GLOVE INDICATOR 8.0 STRL GRN (GLOVE) ×6 IMPLANT
GOWN STRL REUS W/TWL XL LVL3 (GOWN DISPOSABLE) ×18 IMPLANT
KIT BASIN OR (CUSTOM PROCEDURE TRAY) ×6 IMPLANT
MARKER SKIN DUAL TIP RULER LAB (MISCELLANEOUS) ×6 IMPLANT
MESH ULTRAPRO 6X6 15CM15CM (Mesh General) ×12 IMPLANT
MESH VENTRALIGHT ST 6X8 (Mesh Specialty) ×2 IMPLANT
MESH VENTRLGHT ELLIPSE 8X6XMFL (Mesh Specialty) ×4 IMPLANT
NEEDLE SPNL 22GX3.5 QUINCKE BK (NEEDLE) ×6 IMPLANT
PAD POSITIONING PINK XL (MISCELLANEOUS) ×6 IMPLANT
SCISSORS LAP 5X35 DISP (ENDOMECHANICALS) ×6 IMPLANT
SET IRRIG TUBING LAPAROSCOPIC (IRRIGATION / IRRIGATOR) IMPLANT
SHEARS HARMONIC ACE PLUS 36CM (ENDOMECHANICALS) IMPLANT
SLEEVE XCEL OPT CAN 5 100 (ENDOMECHANICALS) ×12 IMPLANT
SPONGE GAUZE 2X2 STER 10/PKG (GAUZE/BANDAGES/DRESSINGS) ×2
STRIP CLOSURE SKIN 1/2X4 (GAUZE/BANDAGES/DRESSINGS) ×5 IMPLANT
SUT MNCRL AB 4-0 PS2 18 (SUTURE) ×6 IMPLANT
SUT PDS AB 1 CT1 27 (SUTURE) ×12 IMPLANT
SUT PROLENE 1 CT 1 30 (SUTURE) ×36 IMPLANT
TOWEL OR 17X26 10 PK STRL BLUE (TOWEL DISPOSABLE) ×6 IMPLANT
TRAY FOLEY W/METER SILVER 14FR (SET/KITS/TRAYS/PACK) IMPLANT
TRAY FOLEY W/METER SILVER 16FR (SET/KITS/TRAYS/PACK) ×6 IMPLANT
TRAY LAPAROSCOPIC (CUSTOM PROCEDURE TRAY) ×6 IMPLANT
TROCAR BLADELESS OPT 5 100 (ENDOMECHANICALS) ×6 IMPLANT
TROCAR XCEL BLUNT TIP 100MML (ENDOMECHANICALS) ×6 IMPLANT
TROCAR XCEL NON-BLD 11X100MML (ENDOMECHANICALS) IMPLANT

## 2015-04-17 NOTE — Anesthesia Postprocedure Evaluation (Signed)
Anesthesia Post Note  Patient: Daniel Nolan  Procedure(s) Performed: Procedure(s) (LRB): LAPAROSCOPIC INCISIONAL WALL HERNIA  REPAIR WITH MESH (N/A) LAPAROSCOPIC  BILATERAL INGUINAL AND LEFT FEMORAL HERNIA REPAIRS WITH MESH (Left) NEEDLE LIVER BIOPSY (N/A) INSERTION OF MESH (Bilateral)  Patient location during evaluation: PACU Anesthesia Type: General Level of consciousness: awake and alert Pain management: pain level controlled Vital Signs Assessment: post-procedure vital signs reviewed and stable Respiratory status: spontaneous breathing, nonlabored ventilation, respiratory function stable and patient connected to nasal cannula oxygen Cardiovascular status: blood pressure returned to baseline and stable Postop Assessment: no signs of nausea or vomiting Anesthetic complications: no    Last Vitals:  Filed Vitals:   04/17/15 1215 04/17/15 1230  BP: 156/92 152/93  Pulse: 60 67  Temp: 36.4 C   Resp: 20 15    Last Pain:  Filed Vitals:   04/17/15 1232  PainSc: 3                  Effie Berkshire

## 2015-04-17 NOTE — Op Note (Signed)
04/17/2015  11:22 AM  PATIENT:  Daniel Nolan  55 y.o. male  Patient Care Team: Gaynelle Arabian, MD as PCP - General (Family Medicine) Lazaro Arms, MD as Consulting Physician (Gastroenterology)  PRE-OPERATIVE DIAGNOSIS:  Incarcerated incisional ventral wall abdominal wall hernia. Probable left inguinal hernia. History of cirrhosis  POST-OPERATIVE DIAGNOSIS:    INCARCERATED INCISONAL WALL HERNIA, BILATERAL INGUINAL AND LEFT FEMORAL HERNIAS CIRRHOSIS  PROCEDURE:   LAPAROSCOPIC INCISIONAL WALL HERNIA  REPAIR WITH MESH LAPAROSCOPIC  BILATERAL INGUINAL AND LEFT FEMORAL HERNIA REPAIRS WITH MESH CORE NEEDLE LIVER BIOPSY INSERTION OF MESH  SURGEON:  Surgeon(s): Michael Boston, MD  ASSISTANT: RN   ANESTHESIA:   Regional ilioinguinal and genitofemoral and spermatic cord nerve blocks with GETA  EBL:  Total I/O In: 2000 [I.V.:2000] Out: 250 [Urine:200; Blood:50]  Delay start of Pharmacological VTE agent (>24hrs) due to surgical blood loss or risk of bleeding:  no  DRAINS: NONE  SPECIMEN:  Liver cores x 3  DISPOSITION OF SPECIMEN:  PATHOLOGY COUNTS:  YES  PLAN OF CARE: Discharge to home after PACU  PATIENT DISPOSITION:  PACU - hemodynamically stable.  INDICATION: Pleasant male with an enlarging periumbilical ventral incisional hernia and discomfort.  Some groin discomfort concern for inguinal hernias well.  Known hepatitis C with possible liver changes on recent study.  Suspicious for cirrhosis.  I offered laparoscopic exploration with repair of hernias found.  Possible liver biopsy.  The anatomy & physiology of the abdominal wall and pelvic floor was discussed.  The pathophysiology of hernias in the inguinal and pelvic region was discussed.  Natural history risks such as progressive enlargement, pain, incarceration & strangulation was discussed.   Contributors to complications such as smoking, obesity, diabetes, prior surgery, etc were discussed.    I feel the risks of no  intervention will lead to serious problems that outweigh the operative risks; therefore, I recommended surgery to reduce and repair the hernia.  I explained laparoscopic techniques with possible need for an open approach.  I noted usual use of mesh to patch and/or buttress hernia repair  Risks such as bleeding, infection, abscess, need for further treatment, heart attack, death, and other risks were discussed.  I noted a good likelihood this will help address the problem.   Goals of post-operative recovery were discussed as well.  Possibility that this will not correct all symptoms was explained.  I stressed the importance of low-impact activity, aggressive pain control, avoiding constipation, & not pushing through pain to minimize risk of post-operative chronic pain or injury. Possibility of reherniation was discussed.  We will work to minimize complications.     An educational handout further explaining the pathology & treatment options was given as well.  Questions were answered.  The patient expresses understanding & wishes to proceed with surgery.  OR FINDINGS: 3.5 x 3 cm periumbilical incisional ventral hernia incarcerated with omentum and preperitoneal fat.  Type of repair - Laparoscopic underlay repair   Name of mesh - Bard Ventralight dual sided (polypropylene / Seprafilm)  Size of mesh - Length 20 cm, Width 15 cm  Mesh overlap - 5-7 cm  Placement of mesh - Intraperitoneal underlay repair  Bilateral indirect inguinal hernias.  Moderate left femoral hernia.  Macular nodular cirrhosis without any strong evidence of portal hypertension.  DESCRIPTION:   Informed consent was confirmed.  The patient underwent general anaesthesia without difficulty.  The patient was positioned appropriately.  VTE prevention in place.  The patient's abdomen was clipped, prepped, & draped in  a sterile fashion.  Surgical timeout confirmed our plan.  The patient was positioned in reverse Trendelenburg.   Abdominal entry with a 98mm laparoscopic port was gained using optical entry technique in the left upper abdomen.  Entry was clean.  I induced carbon dioxide insufflation.  Camera inspection revealed no injury.  I could see bilateral inguinal hernias and confirmed the periumbilical hernia as well.  Could see lobulation on the liver consistent with macronodular cirrhosis.    I side proceed with bilateral inguinal/groin hernia repairs first.  I made a transverse incision through the inferior umbilical fold.  I made a small transverse nick through the anterior rectus fascia contralateral to the inguinal hernia side and placed a 0-vicryl stitch through the fascia.  I placed a Hasson trocar into the preperitoneal plane.  Entry was clean.  We induced carbon dioxide insufflation. Camera inspection revealed no injury.  I used a 26mm angled scope to bluntly free the peritoneum off the infraumbilical anterior abdominal wall.  I created enough of a preperitoneal pocket to place 14mm ports into the right & left mid-abdomen into this preperitoneal cavity.  I focused attention on the right side.   I used blunt & focused sharp dissection to free the peritoneum off the flank and down to the pubic rim.  I freed the anteriolateral bladder wall off the anteriolateral pelvic wall, sparing midline attachments.   I located a swath of peritoneum going into a hernia fascial defect at the internal ring consistent with an indirect inguinal hernia.  I gradually freed the peritoneal hernia sac off safely and reduced it into the preperitoneal space.  I freed the peritoneum off the spermatic vessels & vas deferens.  I skeletonized and removed a few spermatic cord lipomas.  There is some mild laxity at the direct space and femoral foramen, but no true hernias.  No obturator hernia.  I freed peritoneum off the retroperitoneum along the psoas muscle.    I checked & assured hemostasis.    I turned attention on the opposite side.  I did  dissection in a similar, mirror-image fashion. The patient had a slightly larger indirect inguinal hernia and a definite left femoral hernia.  The direct space and obturator region seemed normal.       I chose 15x15 cm sheets of ultra-lightweight polypropylene mesh (Ultrapro), one for each side.  I cut a single sigmoid-shaped slit ~6cm from a corner of each mesh.  I placed the meshes into the preperitoneal space & laid them as overlapping diamonds such that at the inferior points, a 6x6 cm corner flap rested in the true anterolateral pelvis, covering the obturator & femoral foramina.   I allowed the bladder to return to the pubis, this helping tuck the corners of the mesh in the anteriolateral pelvis.  The medial corners overlapped each other across midline cephalad to the pubic rim.   This provided >2 inch coverage around the hernia.  Because the defects well covered and not particularly large, I did not place any tacks.  I held the hernia sacs cephalad & evacuated carbon dioxide.  I induced carbon dioxide insufflation in the intraperitoneal left upper quadrant port. Camera inspection revealed no injury. Extra ports were carefully placed under direct laparoscopic visualization.   I could see the hernia in the central abdomen.  I did laparoscopic lysis of adhesions to expose the entire anterior abdominal wall.  I primarily used and focused scissors.  I made sure hemostasis was good.  I mapped out  the region using a needle passer.   To ensure that I would have at least 5 cm radial coverage outside of the hernia defect, I chose a 20x15 cm dual sided mesh.  I placed #1 Prolene stitches around its edge about every 5 cm = 12 total.  I rolled the mesh & placed into the peritoneal cavity through the 10 cm fascial defect.  I unrolled the mesh and positioned it appropriately.  I excised the hernia sac and incarcerated fat.  I secured the mesh to cover up the hernia defect using a laparoscopic suture passer to pass  the tails of the Prolene through the abdominal wall & tagged them with clamps.  I started out in four corners to make sure I had the mesh centered under the hernia defect appropriately, and then proceeded to work in quadrants.  We evacuated CO2 & desufflated the abdomen.  I tied the fascial stitches down.  I reinsufflated the abdomen.  The mesh provided at least 5-10 cm circumferential coverage around the entire region of hernia defects.   I tacked the edges & central part of the mesh to the peritoneum/posterior rectus fascia with SecureStrap absorbable tacks.   Hemostasis was excellent.  I closed the central hernia using #1 PDS interrupted transverse stitches primarily.  I also placed a #1 PDS through the center part of the mesh laparoscopically to help further close the hernia.    Then proceeded with core liver biopsies given the lobulated nature of it and his history hepatitis C.  I used a 14-gauge TruCut through a right subcostal puncture site and got 3 decent cores in the right hepatic lobe.  I assured hemostasis.  I did reinspection. Hemostasis was good. Mesh laid well. Capnoperitoneum was evacuated. Ports were removed. The skin was closed with Monocryl at the port sites and Steri-Strips on the fascial stitch puncture sites.   I had discussed postoperative care with the patient in the holding area.   I did discuss operative findings and postoperative goals / instructions to the patient's family as well.  Instructions are written in the chart.  We will see if he can go home later today if his pain is well controlled.  [name], M.D., F.A.C.S. Gastrointestinal and Minimally Invasive Surgery [facility] [address], [place], [place] [address] [phone] Main / Paging

## 2015-04-17 NOTE — Anesthesia Preprocedure Evaluation (Addendum)
Anesthesia Evaluation  Patient identified by MRN, date of birth, ID band Patient awake    Reviewed: Allergy & Precautions, NPO status , Patient's Chart, lab work & pertinent test results  History of Anesthesia Complications Negative for: history of anesthetic complications  Airway Mallampati: II  TM Distance: >3 FB Neck ROM: Full    Dental  (+) Teeth Intact   Pulmonary former smoker,    breath sounds clear to auscultation       Cardiovascular negative cardio ROS   Rhythm:Regular Rate:Normal     Neuro/Psych Anxiety negative neurological ROS     GI/Hepatic negative GI ROS, (+) Hepatitis -, C  Endo/Other  negative endocrine ROS  Renal/GU negative Renal ROS  negative genitourinary   Musculoskeletal  (+) Arthritis ,   Abdominal   Peds negative pediatric ROS (+)  Hematology negative hematology ROS (+)   Anesthesia Other Findings   Reproductive/Obstetrics negative OB ROS                           Lab Results  Component Value Date   WBC 4.6 04/09/2015   HGB 14.4 04/09/2015   HCT 42.6 04/09/2015   MCV 93.0 04/09/2015   PLT 185 04/09/2015   Lab Results  Component Value Date   CREATININE 0.90 04/09/2015   BUN 14 04/09/2015   NA 140 04/09/2015   K 4.4 04/09/2015   CL 105 04/09/2015   CO2 27 04/09/2015   No results found for: INR, PROTIME   Anesthesia Physical Anesthesia Plan  ASA: II  Anesthesia Plan: General   Post-op Pain Management:    Induction: Intravenous  Airway Management Planned: Oral ETT  Additional Equipment:   Intra-op Plan:   Post-operative Plan: Extubation in OR  Informed Consent: I have reviewed the patients History and Physical, chart, labs and discussed the procedure including the risks, benefits and alternatives for the proposed anesthesia with the patient or authorized representative who has indicated his/her understanding and acceptance.   Dental  advisory given  Plan Discussed with: CRNA  Anesthesia Plan Comments:        Anesthesia Quick Evaluation

## 2015-04-17 NOTE — H&P (View-Only) (Signed)
Daniel Nolan 10/16/2014 4:59 PM Location: [facility] Patient #: [id number] DOB: 03-15-1961 Married / Language: English / Race: White Male   History of Present Illness [name]; 10/17/2014 1:40 PM) The patient is a 55 year old male who presents with an incisional hernia. Patient returns a year status post initial consultation for incarcerated periumbilical incisional hernia. Pleasant male. History of hepatitis C. Concern for cirrhosis on prior liver biopsies and CT scans. Did have an MR that she seemed them showed improved a few years later. Has not seen a hepatologist for several years. Notice some swelling and pain near his bellybutton. Concern for hernia. I recommended surgical repair. Any anginal issues, he held off on surgery. However now it is more painful and bothersome to him. He is more willing to consider surgery now. Normally has bowel movement about every day. No bad fevers or sweats or nausea or vomiting. He did have an appendectomy done in 2007 but no other abdominal surgeries. Liver biopsy consistent with cirrhosis almost 10 years ago being followed by hepatology at [facility]. Dr. Patsy [place]. Perhaps now Dr. Maceo Pro. Again not seen for several years. Claims he can still hike a few miles without problems. History of MRSA infection in his hand almost a decade ago. Treated with incision and drainage. No new infections.   Other Problems Illene Regulus, CMA; 10/17/2014 9:57 AM) Anxiety Disorder Hepatitis  Past Surgical History Illene Regulus, CMA; 10/17/2014 9:57 AM) Appendectomy  Diagnostic Studies History Lars Mage Spillers, CMA; 10/17/2014 9:57 AM) Colonoscopy 5-10 years ago  Allergies Elbert Ewings, CMA; 10/16/2014 5:00 PM) No Known Drug Allergies07/26/2016  Medication History Elbert Ewings, CMA; 10/16/2014 5:00 PM) No Current Medications Medications Reconciled  Social History Illene Regulus, CMA; 10/17/2014 9:57 AM) Alcohol use  Moderate alcohol use. Caffeine use Coffee. Illicit drug use Remotely quit drug use. Tobacco use Former smoker.  Family History Illene Regulus, CMA; 10/17/2014 9:57 AM) Alcohol Abuse Brother, Father, Mother. Cerebrovascular Accident Mother. Heart disease in male family member before age 82    Review of Systems Illene Regulus CMA; 10/17/2014 9:57 AM) General Not Present- Appetite Loss, Chills, Fatigue, Fever, Night Sweats, Weight Gain and Weight Loss. Skin Not Present- Change in Wart/Mole, Dryness, Hives, Jaundice, New Lesions, Non-Healing Wounds, Rash and Ulcer. HEENT Not Present- Earache, Hearing Loss, Hoarseness, Nose Bleed, Oral Ulcers, Ringing in the Ears, Seasonal Allergies, Sinus Pain, Sore Throat, Visual Disturbances, Wears glasses/contact lenses and Yellow Eyes. Respiratory Not Present- Bloody sputum, Chronic Cough, Difficulty Breathing, Snoring and Wheezing. Breast Not Present- Breast Mass, Breast Pain, Nipple Discharge and Skin Changes. Cardiovascular Not Present- Chest Pain, Difficulty Breathing Lying Down, Leg Cramps, Palpitations, Rapid Heart Rate, Shortness of Breath and Swelling of Extremities. Gastrointestinal Not Present- Abdominal Pain, Bloating, Bloody Stool, Change in Bowel Habits, Chronic diarrhea, Constipation, Difficulty Swallowing, Excessive gas, Gets full quickly at meals, Hemorrhoids, Indigestion, Nausea, Rectal Pain and Vomiting. Male Genitourinary Not Present- Blood in Urine, Change in Urinary Stream, Frequency, Impotence, Nocturia, Painful Urination, Urgency and Urine Leakage. Musculoskeletal Present- Joint Pain and Swelling of Extremities. Not Present- Back Pain, Joint Stiffness, Muscle Pain and Muscle Weakness. Neurological Not Present- Decreased Memory, Fainting, Headaches, Numbness, Seizures, Tingling, Tremor, Trouble walking and Weakness. Psychiatric Present- Anxiety. Not Present- Bipolar, Change in Sleep Pattern, Depression, Fearful and Frequent  crying. Endocrine Not Present- Cold Intolerance, Excessive Hunger, Hair Changes, Heat Intolerance, Hot flashes and New Diabetes. Hematology Not Present- Easy Bruising, Excessive bleeding, Gland problems, HIV and Persistent Infections.  Vitals Elbert Ewings CMA; 10/16/2014  5:00 PM) 10/16/2014 5:00 PM Weight: 193 lb Height: 66in Body Surface Area: 2.02 m Body Mass Index: 31.15 kg/m  Temp.: 97.72F(Oral)  Pulse: 64 (Regular)  BP: 130/70 (Sitting, Left Arm, Standard)     Physical Exam [name]; 10/16/2014 5:39 PM) General Mental Status-Alert. General Appearance-Not in acute distress, Not Sickly. Orientation-Oriented X3. Hydration-Well hydrated. Voice-Normal.  Integumentary Global Assessment Upon inspection and palpation of skin surfaces of the - Axillae: non-tender, no inflammation or ulceration, no drainage. and Distribution of scalp and body hair is normal. General Characteristics Temperature - normal warmth is noted.  Head and Neck Head-normocephalic, atraumatic with no lesions or palpable masses. Face Global Assessment - atraumatic, no absence of expression. Neck Global Assessment - no abnormal movements, no bruit auscultated on the right, no bruit auscultated on the left, no decreased range of motion, non-tender. Trachea-midline. Thyroid Gland Characteristics - non-tender.  Eye Eyeball - Left-Extraocular movements intact, No Nystagmus. Eyeball - Right-Extraocular movements intact, No Nystagmus. Cornea - Left-No Hazy. Cornea - Right-No Hazy. Sclera/Conjunctiva - Left-No scleral icterus, No Discharge. Sclera/Conjunctiva - Right-No scleral icterus, No Discharge. Pupil - Left-Direct reaction to light normal. Pupil - Right-Direct reaction to light normal.  ENMT Ears Pinna - Left - no drainage observed, no generalized tenderness observed. Right - no drainage observed, no generalized tenderness observed. Nose and  Sinuses External Inspection of the Nose - no destructive lesion observed. Inspection of the nares - Left - quiet respiration. Right - quiet respiration. Mouth and Throat Lips - Upper Lip - no fissures observed, no pallor noted. Lower Lip - no fissures observed, no pallor noted. Nasopharynx - no discharge present. Oral Cavity/Oropharynx - Tongue - no dryness observed. Oral Mucosa - no cyanosis observed. Hypopharynx - no evidence of airway distress observed.  Chest and Lung Exam Inspection Movements - Normal and Symmetrical. Accessory muscles - No use of accessory muscles in breathing. Palpation Palpation of the chest reveals - Non-tender. Auscultation Breath sounds - Normal and Clear.  Cardiovascular Auscultation Rhythm - Regular. Murmurs & Other Heart Sounds - Auscultation of the heart reveals - No Murmurs and No Systolic Clicks.  Abdomen Inspection Inspection of the abdomen reveals - No Visible peristalsis and No Abnormal pulsations. Umbilicus - No Bleeding, No Urine drainage. Palpation/Percussion Palpation and Percussion of the abdomen reveal - Soft, Non Tender, No Rebound tenderness, No Rigidity (guarding) and No Cutaneous hyperesthesia. Note: Slightly overweight but soft. Mild diastases recti. Obvious hernia near her umbilicus with scar consistent with prior incision. Incarcerated. Not fully reducible. 4 cm mass.   Male Genitourinary Sexual Maturity Tanner 5 - Adult hair pattern and Adult penile size and shape. Note: No external male genitalia. Testes and epididymides cords normal. Sharp sensitivity LEFT lateral groin suspicious for small LEFT inguinal hernia. However not obvious. No RIGHT inguinal/groin hernia.   Peripheral Vascular Upper Extremity Inspection - Left - No Cyanotic nailbeds, Not Ischemic. Right - No Cyanotic nailbeds, Not Ischemic.  Neurologic Neurologic evaluation reveals -normal attention span and ability to concentrate, able to name objects and repeat  phrases. Appropriate fund of knowledge , normal sensation and normal coordination. Mental Status Affect - not angry, not paranoid. Cranial Nerves-Normal Bilaterally. Gait-Normal.  Neuropsychiatric Mental status exam performed with findings of-able to articulate well with normal speech/language, rate, volume and coherence, thought content normal with ability to perform basic computations and apply abstract reasoning and no evidence of hallucinations, delusions, obsessions or homicidal/suicidal ideation.  Musculoskeletal Global Assessment Spine, Ribs and Pelvis - no instability, subluxation  or laxity. Right Upper Extremity - no instability, subluxation or laxity.  Lymphatic Head & Neck  General Head & Neck Lymphatics: Bilateral - Description - No Localized lymphadenopathy. Axillary  General Axillary Region: Bilateral - Description - No Localized lymphadenopathy. Femoral & Inguinal  Generalized Femoral & Inguinal Lymphatics: Left - Description - No Localized lymphadenopathy. Right - Description - No Localized lymphadenopathy.    Assessment & Plan ( INCISIONAL HERNIA, WITHOUT OBSTRUCTION OR GANGRENE (553.21  K43.2) Impression: Periumbilical incisional hernia from prior laparoscopic appendectomy. Now larger and more symptomatic.  Again think he would benefit from repair. His heavy physical needs and possible cirrhosis, would definitely recommend mesh underlay repair. Reasonable to laparoscopic approach. That'll allow the possibility of liver biopsy to rule out cirrhosis. Also make sure he does not have a LEFT inguinal hernia since he was quite sensitive there. If there is hernia there, would plan to repair at the same time. He is more motivated to consider surgery and is hopeful his co-pay will be as steep this time.  I cautioned him against going back to work too soon. 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 We will see how he is doing the first postoperative visit and adjust. Maybe he'll be able to come back earlier to do some light duty if his supervisor agrees.  Patient his ready to consider surgery now.   Current Plans Schedule for Surgery  Written instructions provided  Pt Education - Pamphlet Given - Laparoscopic Hernia Repair: discussed with patient and provided information. The anatomy & physiology of the abdominal wall and pelvic floor was discussed. The pathophysiology of hernias in the inguinal and pelvic region was discussed. Natural history risks such as progressive enlargement, pain, incarceration, and strangulation was discussed. Contributors to complications such as smoking, obesity, diabetes, prior surgery, etc were discussed.  I feel the risks of no intervention will lead to serious problems that outweigh the operative risks; therefore, I recommended surgery to reduce and repair the hernia. I explained laparoscopic techniques with possible need for an open approach. I noted usual use of mesh to patch and/or buttress hernia repair  Risks such as bleeding, infection, abscess, need for further treatment, heart attack, death, and other risks were discussed. I noted a good likelihood this will help address the problem. Goals of post-operative recovery were discussed as well. Possibility that this will not correct all symptoms was explained. I stressed the importance of low-impact activity, aggressive pain control, avoiding constipation, & not pushing through pain to minimize risk of post-operative chronic pain or injury. Possibility of reherniation was discussed. We will work to minimize complications.  An educational handout further explaining the pathology & treatment options was given as well. Questions were answered. The patient expresses understanding & wishes to proceed with surgery.  Pt Education - [facility] Hernia Post-Op HCI (Taisia Fantini): discussed with patient and provided  information. Pt Education - [facility] Pain Control (Wylder Macomber)  [name], M.D., F.A.C.S. Gastrointestinal and Minimally Invasive Surgery [facility] [address] [place], [place] [address] [phone] Main / Paging

## 2015-04-17 NOTE — Discharge Instructions (Signed)
HERNIA REPAIR: POST OP INSTRUCTIONS ° °1. DIET: Follow a light bland diet the first 24 hours after arrival home, such as soup, liquids, crackers, etc.  Be sure to include lots of fluids daily.  Avoid fast food or heavy meals as your are more likely to get nauseated.  Eat a low fat the next few days after surgery. °2. Take your usually prescribed home medications unless otherwise directed. °3. PAIN CONTROL: °a. Pain is best controlled by a usual combination of three different methods TOGETHER: °i. Ice/Heat °ii. Over the counter pain medication °iii. Prescription pain medication °b. Most patients will experience some swelling and bruising around the hernia(s) such as the bellybutton, groins, or old incisions.  Ice packs or heating pads (30-60 minutes up to 6 times a day) will help. Use ice for the first few days to help decrease swelling and bruising, then switch to heat to help relax tight/sore spots and speed recovery.  Some people prefer to use ice alone, heat alone, alternating between ice & heat.  Experiment to what works for you.  Swelling and bruising can take several weeks to resolve.   °c. It is helpful to take an over-the-counter pain medication regularly for the first few weeks.  Choose one of the following that works best for you: °i. Naproxen (Aleve, etc)  Two 220mg tabs twice a day °ii. Ibuprofen (Advil, etc) Three 200mg tabs four times a day (every meal & bedtime) °iii. Acetaminophen (Tylenol, etc) 325-650mg four times a day (every meal & bedtime) °d. A  prescription for pain medication should be given to you upon discharge.  Take your pain medication as prescribed.  °i. If you are having problems/concerns with the prescription medicine (does not control pain, nausea, vomiting, rash, itching, etc), please call us [phone] to see if we need to switch you to a different pain medicine that will work better for you and/or control your side effect better. °ii. If you need a refill on your pain  medication, please contact your pharmacy.  They will contact our office to request authorization. Prescriptions will not be filled after 5 pm or on week-ends. °4. Avoid getting constipated.  Between the surgery and the pain medications, it is common to experience some constipation.  Increasing fluid intake and taking a fiber supplement (such as Metamucil, Citrucel, FiberCon, MiraLax, etc) 1-2 times a day regularly will usually help prevent this problem from occurring.  A mild laxative (prune juice, Milk of Magnesia, MiraLax, etc) should be taken according to package directions if there are no bowel movements after 48 hours.   °5. Wash / shower every day.  You may shower over the dressings as they are waterproof.   °6. Remove your waterproof bandages 5 days after surgery.  You may leave the incision open to air.  You may replace a dressing/Band-Aid to cover the incision for comfort if you wish.  Continue to shower over incision(s) after the dressing is off. ° ° ° °7. ACTIVITIES as tolerated:   °a. You may resume regular (light) daily activities beginning the next day--such as daily self-care, walking, climbing stairs--gradually increasing activities as tolerated.  If you can walk 30 minutes without difficulty, it is safe to try more intense activity such as jogging, treadmill, bicycling, low-impact aerobics, swimming, etc. °b. Save the most intensive and strenuous activity for last such as sit-ups, heavy lifting, contact sports, etc  Refrain from any heavy lifting or straining until you are off narcotics for pain control.   °  c. DO NOT PUSH THROUGH PAIN.  Let pain be your guide: If it hurts to do something, don't do it.  Pain is your body warning you to avoid that activity for another week until the pain goes down. d. You may drive when you are no longer taking prescription pain medication, you can comfortably wear a seatbelt, and you can safely maneuver your car and apply brakes. e. Dennis Bast may have sexual intercourse  when it is comfortable.  8. FOLLOW UP in our office a. Please call [facility] at [phone] to set up an appointment to see your surgeon in the office for a follow-up appointment approximately 2-3 weeks after your surgery. b. Make sure that you call for this appointment the day you arrive home to insure a convenient appointment time. 9.  IF YOU HAVE DISABILITY OR FAMILY LEAVE FORMS, BRING THEM TO THE OFFICE FOR PROCESSING.  DO NOT GIVE THEM TO YOUR DOCTOR.  WHEN TO CALL us [phone]: 1. Poor pain control 2. Reactions / problems with new medications (rash/itching, nausea, etc)  3. Fever over 101.5 F (38.5 C) 4. Inability to urinate 5. Nausea and/or vomiting 6. Worsening swelling or bruising 7. Continued bleeding from incision. 8. Increased pain, redness, or drainage from the incision   The clinic staff is available to answer your questions during regular business hours (8:30am-5pm).  Please dont hesitate to call and ask to speak to one of our nurses for clinical concerns.   If you have a medical emergency, go to the nearest emergency room or call 911.  A surgeon from [facility] is always on call at the hospitals in [facility], [place], [place], [place], [place]  [address] ?  [address] [address], [place], [place]   [address] MAIN: [phone] ? TOLL FREE: [phone] ? FAX: [phone] [email or web address]  GETTING TO GOOD BOWEL HEALTH. Irregular bowel habits such as constipation and diarrhea can lead to many problems over time.  Having one soft bowel movement a day is the most important way to prevent further problems.  The anorectal canal is designed to handle stretching and feces to safely manage our ability to get rid of solid waste (feces, poop, stool) out of our body.  BUT, hard constipated stools can act like ripping concrete bricks and diarrhea can be a burning fire to this very sensitive area of our body, causing  inflamed hemorrhoids, anal fissures, increasing risk is perirectal abscesses, abdominal pain/bloating, an making irritable bowel worse.      The goal: ONE SOFT BOWEL MOVEMENT A DAY!  To have soft, regular bowel movements:   Drink plenty of fluids, consider 4-6 tall glasses of water a day.    Take plenty of fiber.  Fiber is the undigested part of plant food that passes into the colon, acting s natures broom to encourage bowel motility and movement.  Fiber can absorb and hold large amounts of water. This results in a larger, bulkier stool, which is soft and easier to pass. Work gradually over several weeks up to 6 servings a day of fiber (25g a day even more if needed) in the form of: o Vegetables -- Root (potatoes, carrots, turnips), leafy green (lettuce, salad greens, celery, spinach), or cooked high residue (cabbage, broccoli, etc) o Fruit -- Fresh (unpeeled skin & pulp), Dried (prunes, apricots, cherries, etc ),  or stewed ( applesauce)  o Whole grain breads, pasta, etc (whole wheat)  o Bran cereals  Bulking Agents -- This type of water-retaining fiber generally is easily obtained each day by one of the following:  o Psyllium bran -- The psyllium plant is remarkable because its ground seeds can retain so much water. This product is available as Metamucil, Konsyl, Effersyllium, Per Diem Fiber, or the less expensive generic preparation in drug and health food stores. Although labeled a laxative, it really is not a laxative.  o Methylcellulose -- This is another fiber derived from wood which also retains water. It is available as Citrucel. o Polyethylene Glycol - and artificial fiber commonly called Miralax or Glycolax.  It is helpful for people with gassy or bloated feelings with regular fiber o Flax Seed - a less gassy fiber than psyllium  No reading or other relaxing activity while on the toilet. If bowel movements take longer than 5 minutes, you are too constipated  AVOID CONSTIPATION.   High fiber and water intake usually takes care of this.  Sometimes a laxative is needed to stimulate more frequent bowel movements, but   Laxatives are not a good long-term solution as it can wear the colon out.  They can help jump-start bowels if constipated, but should be relied on constantly without discussing with your doctor o Osmotics (Milk of Magnesia, Fleets phosphosoda, Magnesium citrate, MiraLax, GoLytely) are safer than  o Stimulants (Senokot, Castor Oil, Dulcolax, Ex Lax)    o Avoid taking laxatives for more than 7 days in a row.   IF SEVERELY CONSTIPATED, try a Bowel Retraining Program: o Do not use laxatives.  o Eat a diet high in roughage, such as bran cereals and leafy vegetables.  o Drink six (6) ounces of prune or apricot juice each morning.  o Eat two (2) large servings of stewed fruit each day.  o Take one (1) heaping tablespoon of a psyllium-based bulking agent twice a day. Use sugar-free sweetener when possible to avoid excessive calories.  o Eat a normal breakfast.  o Set aside 15 minutes after breakfast to sit on the toilet, but do not strain to have a bowel movement.  o If you do not have a bowel movement by the third day, use an enema and repeat the above steps.   Controlling diarrhea o Switch to liquids and simpler foods for a few days to avoid stressing your intestines further. o Avoid dairy products (especially milk & ice cream) for a short time.  The intestines often can lose the ability to digest lactose when stressed. o Avoid foods that cause gassiness or bloating.  Typical foods include beans and other legumes, cabbage, broccoli, and dairy foods.  Every person has some sensitivity to other foods, so listen to our body and avoid those foods that trigger problems for you. o Adding fiber (Citrucel, Metamucil, psyllium, Miralax) gradually can help thicken stools by absorbing excess fluid and retrain the intestines to act more normally.  Slowly increase the dose over  a few weeks.  Too much fiber too soon can backfire and cause cramping & bloating. o Probiotics (such as active yogurt, Align, etc) may help repopulate the intestines and colon with normal bacteria and calm down a sensitive digestive tract.  Most studies show it to be of mild help, though, and such products can be costly. o Medicines: - Bismuth subsalicylate (ex. Kayopectate, Pepto Bismol) every 30 minutes for up to 6 doses can help control diarrhea.  Avoid if pregnant. - Loperamide (Immodium) can slow down diarrhea.  Start with two tablets (23m total) first  and then try one tablet every 6 hours.  Avoid if you are having fevers or severe pain.  If you are not better or start feeling worse, stop all medicines and call your doctor for advice o Call your doctor if you are getting worse or not better.  Sometimes further testing (cultures, endoscopy, X-ray studies, bloodwork, etc) may be needed to help diagnose and treat the cause of the diarrhea.  TROUBLESHOOTING IRREGULAR BOWELS 1) Avoid extremes of bowel movements (no bad constipation/diarrhea) 2) Miralax 17gm mixed in 8oz. water or juice-daily. May use BID as needed.  3) Gas-x,Phazyme, etc. as needed for gas & bloating.  4) Soft,bland diet. No spicy,greasy,fried foods.  5) Prilosec over-the-counter as needed  6) May hold gluten/wheat products from diet to see if symptoms improve.  7)  May try probiotics (Align, Activa, etc) to help calm the bowels down 7) If symptoms become worse call back immediately.   General Anesthesia, Adult, Care After Refer to this sheet in the next few weeks. These instructions provide you with information on caring for yourself after your procedure. Your health care provider may also give you more specific instructions. Your treatment has been planned according to current medical practices, but problems sometimes occur. Call your health care provider if you have any problems or questions after your procedure. WHAT TO  EXPECT AFTER THE PROCEDURE After the procedure, it is typical to experience:  Sleepiness.  Nausea and vomiting. HOME CARE INSTRUCTIONS  For the first 24 hours after general anesthesia:  Have a responsible person with you.  Do not drive a car. If you are alone, do not take public transportation.  Do not drink alcohol.  Do not take medicine that has not been prescribed by your health care provider.  Do not sign important papers or make important decisions.  You may resume a normal diet and activities as directed by your health care provider.  Change bandages (dressings) as directed.  If you have questions or problems that seem related to general anesthesia, call the hospital and ask for the anesthetist or anesthesiologist on call. SEEK MEDICAL CARE IF:  You have nausea and vomiting that continue the day after anesthesia.  You develop a rash. SEEK IMMEDIATE MEDICAL CARE IF:   You have difficulty breathing.  You have chest pain.  You have any allergic problems.   This information is not intended to replace advice given to you by your health care provider. Make sure you discuss any questions you have with your health care provider.   Document Released: 06/15/2000 Document Revised: 03/30/2014 Document Reviewed: 07/08/2011 Elsevier Interactive Patient Education [employer].

## 2015-04-17 NOTE — Anesthesia Procedure Notes (Signed)
Procedure Name: Intubation Performed by: Leonette Tischer J Pre-anesthesia Checklist: Patient identified, Emergency Drugs available, Suction available, Patient being monitored and Timeout performed Patient Re-evaluated:Patient Re-evaluated prior to inductionOxygen Delivery Method: Circle system utilized Preoxygenation: Pre-oxygenation with 100% oxygen Intubation Type: IV induction Ventilation: Mask ventilation without difficulty Laryngoscope Size: Mac and 4 Grade View: Grade I Tube type: Oral Tube size: 7.5 mm Number of attempts: 1 Airway Equipment and Method: Stylet Placement Confirmation: ETT inserted through vocal cords under direct vision,  positive ETCO2,  CO2 detector and breath sounds checked- equal and bilateral Secured at: 23 cm Tube secured with: Tape Dental Injury: Teeth and Oropharynx as per pre-operative assessment        

## 2015-04-17 NOTE — Progress Notes (Signed)
Patient is able to ambulate in hallway from 1309 to 1301. Tolerated well. Able to void in bathroom clear yellow urine.

## 2015-04-17 NOTE — Transfer of Care (Signed)
Immediate Anesthesia Transfer of Care Note  Patient: Daniel Nolan  Procedure(s) Performed: Procedure(s) with comments: LAPAROSCOPIC INCISIONAL WALL HERNIA  REPAIR WITH MESH (N/A) LAPAROSCOPIC  BILATERAL INGUINAL AND LEFT FEMORAL HERNIA REPAIRS WITH MESH (Left) NEEDLE LIVER BIOPSY (N/A) INSERTION OF MESH (Bilateral) - AND MESH INSERTED IN ABDOMEN ALSO  Patient Location: PACU  Anesthesia Type:General  Level of Consciousness: awake, alert  and oriented  Airway & Oxygen Therapy: Patient Spontanous Breathing and Patient connected to face mask oxygen  Post-op Assessment: Report given to RN and Post -op Vital signs reviewed and stable  Post vital signs: Reviewed and stable  Last Vitals:  Filed Vitals:   04/17/15 0653  BP: 174/100  Pulse: 64  Temp: 36.4 C  Resp: 18    Complications: No apparent anesthesia complications

## 2015-04-17 NOTE — Interval H&P Note (Signed)
History and Physical Interval Note:  04/17/2015 8:19 AM  Daniel Nolan  has presented today for surgery, with the diagnosis of Incarcerated incisional ventral wall abdominal wall hernia. Probable left inguinal hernia. History of cirrhosis  The various methods of treatment have been discussed with the patient and family. After consideration of risks, benefits and other options for treatment, the patient has consented to  Procedure(s): LAPAROSCOPIC VENTRAL WALL  HERNIA (N/A) LAPAROSCOPIC POSSIBLE LEFT INGUINAL HERNIA (Left) POSSIBLE NEEDLE LIVER BIOPSY (N/A) as a surgical intervention .  The patient's history has been reviewed, patient examined, no change in status, stable for surgery.  I have reviewed the patient's chart and labs.  Questions were answered to the patient's satisfaction.     Abigail Marsiglia C.

## 2015-04-17 NOTE — Progress Notes (Signed)
Foley removed at 1350 upon arrival to Short Stay. IV running and patient drinking po fluids. Ambulated with RN to restroom. Unable to urinate. Sitting up in recliner talking to wife. Pain manageable level. Much more alert/awake than immediately post op. Will continue to monitor.

## 2015-04-18 ENCOUNTER — Encounter (HOSPITAL_COMMUNITY): Payer: Self-pay | Admitting: Surgery

## 2015-04-29 ENCOUNTER — Other Ambulatory Visit: Payer: Self-pay | Admitting: Surgery

## 2015-07-08 ENCOUNTER — Other Ambulatory Visit: Payer: Self-pay | Admitting: Nurse Practitioner

## 2015-07-08 DIAGNOSIS — K7469 Other cirrhosis of liver: Secondary | ICD-10-CM

## 2015-07-15 ENCOUNTER — Ambulatory Visit
Admission: RE | Admit: 2015-07-15 | Discharge: 2015-07-15 | Disposition: A | Discharge status: Home / Self Care | Payer: Managed Care, Other (non HMO) | Source: Ambulatory Visit | Attending: Nurse Practitioner | Admitting: Nurse Practitioner

## 2015-07-15 DIAGNOSIS — K7469 Other cirrhosis of liver: Secondary | ICD-10-CM

## 2016-01-06 ENCOUNTER — Other Ambulatory Visit: Payer: Self-pay | Admitting: Nurse Practitioner

## 2016-01-06 DIAGNOSIS — K7469 Other cirrhosis of liver: Secondary | ICD-10-CM

## 2016-01-20 ENCOUNTER — Ambulatory Visit
Admission: RE | Admit: 2016-01-20 | Discharge: 2016-01-20 | Disposition: A | Discharge status: Home / Self Care | Payer: Managed Care, Other (non HMO) | Source: Ambulatory Visit | Attending: Nurse Practitioner | Admitting: Nurse Practitioner

## 2016-01-20 DIAGNOSIS — K7469 Other cirrhosis of liver: Secondary | ICD-10-CM

## 2016-06-22 ENCOUNTER — Other Ambulatory Visit: Payer: Self-pay | Admitting: Nurse Practitioner

## 2016-06-22 DIAGNOSIS — K7469 Other cirrhosis of liver: Secondary | ICD-10-CM

## 2016-07-13 ENCOUNTER — Other Ambulatory Visit: Payer: Managed Care, Other (non HMO)

## 2016-07-13 ENCOUNTER — Ambulatory Visit
Admission: RE | Admit: 2016-07-13 | Discharge: 2016-07-13 | Disposition: A | Discharge status: Home / Self Care | Payer: Managed Care, Other (non HMO) | Source: Ambulatory Visit | Attending: Nurse Practitioner | Admitting: Nurse Practitioner

## 2016-07-13 DIAGNOSIS — K7469 Other cirrhosis of liver: Secondary | ICD-10-CM

## 2017-04-27 ENCOUNTER — Other Ambulatory Visit: Payer: Self-pay | Admitting: Nurse Practitioner

## 2017-04-27 DIAGNOSIS — K7469 Other cirrhosis of liver: Secondary | ICD-10-CM

## 2017-05-10 ENCOUNTER — Ambulatory Visit
Admission: RE | Admit: 2017-05-10 | Discharge: 2017-05-10 | Disposition: A | Discharge status: Home / Self Care | Payer: Managed Care, Other (non HMO) | Source: Ambulatory Visit | Attending: Nurse Practitioner | Admitting: Nurse Practitioner

## 2017-05-10 DIAGNOSIS — K7469 Other cirrhosis of liver: Secondary | ICD-10-CM

## 2017-10-28 ENCOUNTER — Other Ambulatory Visit: Payer: Self-pay | Admitting: Nurse Practitioner

## 2017-10-28 DIAGNOSIS — K7469 Other cirrhosis of liver: Secondary | ICD-10-CM

## 2017-11-08 ENCOUNTER — Ambulatory Visit
Admission: RE | Admit: 2017-11-08 | Discharge: 2017-11-08 | Disposition: A | Discharge status: Home / Self Care | Payer: Managed Care, Other (non HMO) | Source: Ambulatory Visit | Attending: Nurse Practitioner | Admitting: Nurse Practitioner

## 2017-11-08 DIAGNOSIS — K7469 Other cirrhosis of liver: Secondary | ICD-10-CM

## 2018-04-26 ENCOUNTER — Other Ambulatory Visit: Payer: Self-pay | Admitting: Nurse Practitioner

## 2018-04-26 DIAGNOSIS — K7469 Other cirrhosis of liver: Secondary | ICD-10-CM

## 2018-05-02 ENCOUNTER — Ambulatory Visit
Admission: RE | Admit: 2018-05-02 | Discharge: 2018-05-02 | Disposition: A | Discharge status: Home / Self Care | Payer: Managed Care, Other (non HMO) | Source: Ambulatory Visit | Attending: Nurse Practitioner | Admitting: Nurse Practitioner

## 2018-05-02 DIAGNOSIS — K7469 Other cirrhosis of liver: Secondary | ICD-10-CM

## 2018-11-02 ENCOUNTER — Other Ambulatory Visit: Payer: Self-pay | Admitting: Nurse Practitioner

## 2018-11-02 DIAGNOSIS — K7469 Other cirrhosis of liver: Secondary | ICD-10-CM

## 2018-12-05 ENCOUNTER — Ambulatory Visit
Admission: RE | Admit: 2018-12-05 | Discharge: 2018-12-05 | Disposition: A | Discharge status: Home / Self Care | Payer: Managed Care, Other (non HMO) | Source: Ambulatory Visit | Attending: Nurse Practitioner | Admitting: Nurse Practitioner

## 2018-12-05 DIAGNOSIS — K7469 Other cirrhosis of liver: Secondary | ICD-10-CM

## 2019-05-19 ENCOUNTER — Other Ambulatory Visit: Payer: Self-pay | Admitting: Nurse Practitioner

## 2019-05-19 DIAGNOSIS — K7469 Other cirrhosis of liver: Secondary | ICD-10-CM

## 2019-06-05 ENCOUNTER — Ambulatory Visit
Admission: RE | Admit: 2019-06-05 | Discharge: 2019-06-05 | Disposition: A | Discharge status: Home / Self Care | Payer: Managed Care, Other (non HMO) | Source: Ambulatory Visit | Attending: Nurse Practitioner | Admitting: Nurse Practitioner

## 2019-06-05 DIAGNOSIS — K7469 Other cirrhosis of liver: Secondary | ICD-10-CM

## 2019-10-04 ENCOUNTER — Encounter: Payer: Self-pay | Admitting: Emergency Medicine

## 2019-10-04 ENCOUNTER — Ambulatory Visit
Admission: EM | Admit: 2019-10-04 | Discharge: 2019-10-04 | Disposition: A | Discharge status: Home / Self Care | Payer: Managed Care, Other (non HMO) | Attending: Emergency Medicine | Admitting: Emergency Medicine

## 2019-10-04 ENCOUNTER — Other Ambulatory Visit: Payer: Self-pay

## 2019-10-04 DIAGNOSIS — J0100 Acute maxillary sinusitis, unspecified: Secondary | ICD-10-CM | POA: Diagnosis not present

## 2019-10-04 DIAGNOSIS — R0989 Other specified symptoms and signs involving the circulatory and respiratory systems: Secondary | ICD-10-CM

## 2019-10-04 DIAGNOSIS — R059 Cough, unspecified: Secondary | ICD-10-CM

## 2019-10-04 DIAGNOSIS — R0981 Nasal congestion: Secondary | ICD-10-CM

## 2019-10-04 MED ORDER — AMOXICILLIN-POT CLAVULANATE 875-125 MG PO TABS: 1.0000 | ORAL_TABLET | Freq: Two times a day (BID) | ORAL | 0 refills | Status: AC

## 2019-10-04 MED ORDER — CETIRIZINE HCL 10 MG PO TABS: 10.0000 mg | ORAL_TABLET | Freq: Every day | ORAL | 0 refills | Status: DC

## 2019-10-04 MED ORDER — FLUTICASONE PROPIONATE 50 MCG/ACT NA SUSP: 2.0000 | Freq: Every day | NASAL | 0 refills | Status: DC

## 2019-10-04 NOTE — ED Provider Notes (Signed)
[place]   [id number] 10/04/19 Arrival Time: 9741   CC: "sinus infection"  SUBJECTIVE: History from: patient.  LAVARR PRESIDENT is a 59 y.o. male who presents with nasal congestion, sinus congestion/ pain/ pressure, runny nose, and cough x 4- 5 days.  Denies sick exposure to COVID, flu or strep.  Has tried OTC medications without relief.  Denies aggravating factors.  Reports previous symptoms in the past with sinus infection.   Denies fever, chills, fatigue, SOB, wheezing, chest pain, nausea, changes in bowel or bladder habits.    Had [place] in December.  Has not received the vaccines.    ROS: As per HPI.  All other pertinent ROS negative.     Past Medical History:  Diagnosis Date  . Anxiety   . Arthritis   . Complication of anesthesia    awaken during upper GI endoscopy   . HCV (hepatitis C virus)   . History of kidney stones   . MRSA (methicillin resistant Staphylococcus aureus) infection 2005   left pinky finger.  I&D x 2 Dr Amedeo Plenty  . Wears glasses    Past Surgical History:  Procedure Laterality Date  . APPENDECTOMY  05/28/2005   Dr Irving Shows  . colonscopy    . INCISION / DRAINAGE HAND / FINGER  2005   Incision and drainage left hand deep abscess with  . INGUINAL HERNIA REPAIR Left 04/17/2015   Procedure: LAPAROSCOPIC  BILATERAL INGUINAL AND LEFT FEMORAL HERNIA REPAIRS WITH MESH;  Surgeon: Michael Boston, MD;  Location: [facility];  Service: General;  Laterality: Left;  . INSERTION OF MESH Bilateral 04/17/2015   Procedure: INSERTION OF MESH;  Surgeon: Michael Boston, MD;  Location: [facility];  Service: General;  Laterality: Bilateral;  AND MESH INSERTED IN ABDOMEN ALSO  . LIVER BIOPSY N/A 04/17/2015   Procedure: NEEDLE LIVER BIOPSY;  Surgeon: Michael Boston, MD;  Location: [facility];  Service: General;  Laterality: N/A;  . UPPER GI ENDOSCOPY    . VASECTOMY  2006  . VENTRAL HERNIA REPAIR N/A 04/17/2015   Procedure: LAPAROSCOPIC INCISIONAL WALL HERNIA  REPAIR WITH MESH;   Surgeon: Michael Boston, MD;  Location: [facility];  Service: General;  Laterality: N/A;   No Known Allergies No current facility-administered medications on file prior to encounter.   Current Outpatient Medications on File Prior to Encounter  Medication Sig Dispense Refill  . lisinopril (ZESTRIL) 5 MG tablet Take 5 mg by mouth daily.    . APPLE CIDER VINEGAR PO Take by mouth.    . Cyanocobalamin (VITAMIN B12 PO) Take 1 tablet by mouth daily.    . naproxen (NAPROSYN) 500 MG tablet Take 1 tablet (500 mg total) by mouth 2 (two) times daily with a meal. 40 tablet 1  . Omega-3 Fatty Acids (FISH OIL PO) Take 4 capsules by mouth daily.    . TURMERIC PO Take 2 capsules by mouth daily.     Social History   Socioeconomic History  . Marital status: Married    Spouse name: Not on file  . Number of children: Not on file  . Years of education: Not on file  . Highest education level: Not on file  Occupational History  . Not on file  Tobacco Use  . Smoking status: Former Smoker    Packs/day: 1.50    Years: 8.00    Pack years: 12.00    Types: Cigarettes    Quit date: 11/21/1976    Years since quitting: 42.8  . Smokeless tobacco:  Never Used  Substance and Sexual Activity  . Alcohol use: Yes    Comment: occasionally   . Drug use: Yes    Types: Marijuana    Comment: has not used drugs since 1970's  . Sexual activity: Not on file  Other Topics Concern  . Not on file  Social History Narrative  . Not on file   Social Determinants of Health   Financial Resource Strain:   . Difficulty of Paying Living Expenses:   Food Insecurity:   . Worried About Charity fundraiser in the Last Year:   . Arboriculturist in the Last Year:   Transportation Needs:   . Film/video editor (Medical):   Marland Kitchen Lack of Transportation (Non-Medical):   Physical Activity:   . Days of Exercise per Week:   . Minutes of Exercise per Session:   Stress:   . Feeling of Stress :   Social Connections:   . Frequency of  Communication with Friends and Family:   . Frequency of Social Gatherings with Friends and Family:   . Attends Religious Services:   . Active Member of Clubs or Organizations:   . Attends Archivist Meetings:   Marland Kitchen Marital Status:   Intimate Partner Violence:   . Fear of Current or Ex-Partner:   . Emotionally Abused:   Marland Kitchen Physically Abused:   . Sexually Abused:    Family History  Problem Relation Age of Onset  . Cancer Mother        brain  . Heart disease Father     OBJECTIVE:  Vitals:   10/04/19 1640  BP: 132/88  Pulse: 79  Resp: 17  Temp: 98.9 F (37.2 C)  TempSrc: Tympanic  SpO2: 96%     General appearance: alert; appears fatigued, but nontoxic; speaking in full sentences and tolerating own secretions HEENT: NCAT; Ears: EACs clear, TMs pearly gray; Eyes: PERRL.  EOM grossly intact. Sinuses: TTP over maxillary sinuses; Nose: nares patent without rhinorrhea, Throat: oropharynx clear, tonsils non erythematous or enlarged, uvula midline  Neck: supple without LAD Lungs: unlabored respirations, symmetrical air entry; cough: absent; no respiratory distress; CTAB Heart: regular rate and rhythm.  Skin: warm and dry Psychological: alert and cooperative; normal mood and affect   ASSESSMENT & PLAN:  1. Acute non-recurrent maxillary sinusitis   2. Sinus congestion   3. Runny nose   4. Cough     Meds ordered this encounter  Medications  . amoxicillin-clavulanate (AUGMENTIN) 875-125 MG tablet    Sig: Take 1 tablet by mouth every 12 (twelve) hours for 10 days.    Dispense:  20 tablet    Refill:  0    Order Specific Question:   Supervising Provider    Answer:   Raylene Everts [[id number]]  . cetirizine (ZYRTEC) 10 MG tablet    Sig: Take 1 tablet (10 mg total) by mouth daily.    Dispense:  30 tablet    Refill:  0    Order Specific Question:   Supervising Provider    Answer:   Raylene Everts [[id number]]  . fluticasone (FLONASE) 50 MCG/ACT nasal spray    Sig:  Place 2 sprays into both nostrils daily.    Dispense:  16 g    Refill:  0    Order Specific Question:   Supervising Provider    Answer:   Raylene Everts [[id number]]   Rest and push fluids Augmentin prescribed.  Take as directed and to completion  Zyrtec and flonase prescribed use as needed Continue with OTC ibuprofen/tylenol as needed for pain Follow up with PCP if symptoms persists Return or go to the ED if you have any new or worsening symptoms such as fever, chills, worsening sinus pain/pressure, cough, sore throat, chest pain, shortness of breath, abdominal pain, changes in bowel or bladder habits, etc...   Reviewed expectations re: course of current medical issues. Questions answered. Outlined signs and symptoms indicating need for more acute intervention. Patient verbalized understanding. After Visit Summary given.         Lestine Box, PA-C 10/04/19 1655

## 2019-10-04 NOTE — Discharge Instructions (Addendum)
Rest and push fluids Augmentin prescribed.  Take as directed and to completion Zyrtec and flonase prescribed use as needed Continue with OTC ibuprofen/tylenol as needed for pain Follow up with PCP if symptoms persists Return or go to the ED if you have any new or worsening symptoms such as fever, chills, worsening sinus pain/pressure, cough, sore throat, chest pain, shortness of breath, abdominal pain, changes in bowel or bladder habits, etc..Marland Kitchen

## 2019-10-04 NOTE — ED Triage Notes (Signed)
Pt presents with complaints of nasal congestion, runny nose and cough x 4 days. Denies concern for covid, patient is fully vaccinated. Pt is also experiencing sinus pressure.

## 2019-12-04 ENCOUNTER — Other Ambulatory Visit: Payer: Self-pay | Admitting: Nurse Practitioner

## 2019-12-04 DIAGNOSIS — K7469 Other cirrhosis of liver: Secondary | ICD-10-CM

## 2019-12-11 ENCOUNTER — Ambulatory Visit
Admission: RE | Admit: 2019-12-11 | Discharge: 2019-12-11 | Disposition: A | Discharge status: Home / Self Care | Payer: Managed Care, Other (non HMO) | Source: Ambulatory Visit | Attending: Nurse Practitioner | Admitting: Nurse Practitioner

## 2019-12-11 DIAGNOSIS — K7469 Other cirrhosis of liver: Secondary | ICD-10-CM

## 2020-08-28 ENCOUNTER — Other Ambulatory Visit: Payer: Self-pay | Admitting: Nurse Practitioner

## 2020-08-28 DIAGNOSIS — B182 Chronic viral hepatitis C: Secondary | ICD-10-CM

## 2020-09-16 ENCOUNTER — Ambulatory Visit
Admission: RE | Admit: 2020-09-16 | Discharge: 2020-09-16 | Disposition: A | Discharge status: Home / Self Care | Payer: Managed Care, Other (non HMO) | Source: Ambulatory Visit | Attending: Nurse Practitioner | Admitting: Nurse Practitioner

## 2020-09-16 DIAGNOSIS — B182 Chronic viral hepatitis C: Secondary | ICD-10-CM

## 2020-11-04 ENCOUNTER — Ambulatory Visit (INDEPENDENT_AMBULATORY_CARE_PROVIDER_SITE_OTHER): Payer: Managed Care, Other (non HMO)

## 2020-11-04 ENCOUNTER — Encounter: Payer: Self-pay | Admitting: Emergency Medicine

## 2020-11-04 ENCOUNTER — Ambulatory Visit
Admission: EM | Admit: 2020-11-04 | Discharge: 2020-11-04 | Disposition: A | Discharge status: Home / Self Care | Payer: Managed Care, Other (non HMO) | Attending: Family Medicine | Admitting: Family Medicine

## 2020-11-04 ENCOUNTER — Other Ambulatory Visit: Payer: Self-pay

## 2020-11-04 DIAGNOSIS — M25531 Pain in right wrist: Secondary | ICD-10-CM

## 2020-11-04 DIAGNOSIS — M25532 Pain in left wrist: Secondary | ICD-10-CM | POA: Diagnosis not present

## 2020-11-04 DIAGNOSIS — S53401A Unspecified sprain of right elbow, initial encounter: Secondary | ICD-10-CM

## 2020-11-04 DIAGNOSIS — M79631 Pain in right forearm: Secondary | ICD-10-CM

## 2020-11-04 DIAGNOSIS — M79621 Pain in right upper arm: Secondary | ICD-10-CM

## 2020-11-04 DIAGNOSIS — S63502A Unspecified sprain of left wrist, initial encounter: Secondary | ICD-10-CM

## 2020-11-04 MED ORDER — KETOROLAC TROMETHAMINE 30 MG/ML IJ SOLN
30.0000 mg | Freq: Once | INTRAMUSCULAR | Status: AC
  Administered 2020-11-04: 30 mg via INTRAMUSCULAR

## 2020-11-04 MED ORDER — TIZANIDINE HCL 4 MG PO TABS: 4.0000 mg | ORAL_TABLET | Freq: Four times a day (QID) | ORAL | 0 refills | Status: AC | PRN

## 2020-11-04 MED ORDER — PREDNISONE 20 MG PO TABS: ORAL_TABLET | ORAL | 0 refills | Status: DC

## 2020-11-04 NOTE — Discharge Instructions (Addendum)
Start prednisone taper today. Tizanidine for acute pain. Recommend elevation and application of ice to elbow and wrist to reduce swelling.

## 2020-11-04 NOTE — ED Triage Notes (Signed)
Pt presents today with c/o of bilateral arm pain from fall this morning. He is unable to move right arm (pain at elbow) and left wrist pain/swelling.

## 2020-11-04 NOTE — ED Provider Notes (Signed)
[facility]    CSN: [id number] Arrival date & time: 11/04/20  0831      History   Chief Complaint Chief Complaint  Patient presents with   Fall   Arm Injury    bilateral    HPI Daniel Nolan is a 60 y.o. male.   HPI Patient here for evaluation of multiple injuries following a fall.  Patient endorses falling out of a building and landing with an outstretched hand injuring his left wrist which is swollen and difficult to flex.  He is also having pain at the right elbow that is shooting up into his bicep along with generalized forearm pain.  Denies any prior fractures involving affected areas. He has swelling and endorses severe pain with any movement of left wrist or right forearm. He has not taken any medication prior to arrival here or applied ice to injury. Past Medical History:  Diagnosis Date   Anxiety    Arthritis    Complication of anesthesia    awaken during upper GI endoscopy    HCV (hepatitis C virus)    History of kidney stones    MRSA (methicillin resistant Staphylococcus aureus) infection 2005   left pinky finger.  I&D x 2 Dr Amedeo Plenty   Wears glasses     Patient Active Problem List   Diagnosis Date Noted   Hepatic cirrhosis ([place]) 10/16/2014   Incisional hernia, umbilical - incarcerated 08/18/2013   Obesity (BMI 30-39.9) 08/18/2013   HCV (hepatitis C virus)    Arthritis     Past Surgical History:  Procedure Laterality Date   APPENDECTOMY  05/28/2005   Dr Irving Shows   colonscopy     INCISION / DRAINAGE HAND / FINGER  2005   Incision and drainage left hand deep abscess with   INGUINAL HERNIA REPAIR Left 04/17/2015   Procedure: LAPAROSCOPIC  BILATERAL INGUINAL AND LEFT FEMORAL HERNIA REPAIRS WITH MESH;  Surgeon: Michael Boston, MD;  Location: [facility];  Service: General;  Laterality: Left;   INSERTION OF MESH Bilateral 04/17/2015   Procedure: INSERTION OF MESH;  Surgeon: Michael Boston, MD;  Location: [facility];  Service: General;  Laterality:  Bilateral;  AND MESH INSERTED IN ABDOMEN ALSO   LIVER BIOPSY N/A 04/17/2015   Procedure: NEEDLE LIVER BIOPSY;  Surgeon: Michael Boston, MD;  Location: [facility];  Service: General;  Laterality: N/A;   UPPER GI ENDOSCOPY     VASECTOMY  2006   VENTRAL HERNIA REPAIR N/A 04/17/2015   Procedure: LAPAROSCOPIC INCISIONAL WALL HERNIA  REPAIR WITH MESH;  Surgeon: Michael Boston, MD;  Location: [facility];  Service: General;  Laterality: N/A;       Home Medications    Prior to Admission medications   Medication Sig Start Date End Date Taking? Authorizing Provider  predniSONE (DELTASONE) 20 MG tablet Take 3 PO QAM x3days, 2 PO QAM x3days, 1 PO QAM x3days 11/04/20  Yes Scot Jun, FNP  tiZANidine (ZANAFLEX) 4 MG tablet Take 1 tablet (4 mg total) by mouth every 6 (six) hours as needed for muscle spasms. 11/04/20  Yes Scot Jun, FNP  APPLE CIDER VINEGAR PO Take by mouth.    [provider]  cetirizine (ZYRTEC) 10 MG tablet Take 1 tablet (10 mg total) by mouth daily. 10/04/19   Wurst, [place], PA-C  Cyanocobalamin (VITAMIN B12 PO) Take 1 tablet by mouth daily.    [provider]  fluticasone (FLONASE) 50 MCG/ACT nasal spray Place 2 sprays into both nostrils daily.  10/04/19   Wurst, [place], PA-C  lisinopril (ZESTRIL) 5 MG tablet Take 5 mg by mouth daily.    [provider]  naproxen (NAPROSYN) 500 MG tablet Take 1 tablet (500 mg total) by mouth 2 (two) times daily with a meal. 04/17/15   Michael Boston, MD  Omega-3 Fatty Acids (FISH OIL PO) Take 4 capsules by mouth daily.    [provider]  TURMERIC PO Take 2 capsules by mouth daily.    [provider]    Family History Family History  Problem Relation Age of Onset   Cancer Mother        brain   Heart disease Father     Social History Social History   Tobacco Use   Smoking status: Former    Packs/day: 1.50    Years: 8.00    Pack years: 12.00    Types: Cigarettes    Quit date: 11/21/1976     Years since quitting: 43.9   Smokeless tobacco: Never  Substance Use Topics   Alcohol use: Yes    Comment: occasionally    Drug use: Yes    Types: Marijuana    Comment: has not used drugs since 1970's     Allergies   Patient has no known allergies. Review of Systems Review of Systems Pertinent negatives listed in HPI   Physical Exam Triage Vital Signs ED Triage Vitals [11/04/20 0914]  Enc Vitals Group     BP (!) 156/98     Pulse Rate 84     Resp 20     Temp 98.6 F (37 C)     Temp Source Oral     SpO2 98 %     Weight      Height      Head Circumference      Peak Flow      Pain Score 9     Pain Loc      Pain Edu?      Excl. in [place]?    No data found.  Updated Vital Signs BP (!) 156/98 (BP Location: Right Arm)   Pulse 84   Temp 98.6 F (37 C) (Oral)   Resp 20   SpO2 98%   Visual Acuity Right Eye Distance:   Left Eye Distance:   Bilateral Distance:    Right Eye Near:   Left Eye Near:    Bilateral Near:     Physical Exam Constitutional:      Appearance: Normal appearance.  Cardiovascular:     Rate and Rhythm: Normal rate and regular rhythm.  Pulmonary:     Effort: Pulmonary effort is normal.     Breath sounds: Normal breath sounds.  Musculoskeletal:        General: No deformity.     Right elbow: Swelling present. Decreased range of motion. Tenderness present.     Right wrist: Tenderness present.     Left wrist: Swelling, tenderness and bony tenderness present. Decreased range of motion.  Neurological:     Mental Status: He is alert.     UC Treatments / Results  Labs (all labs ordered are listed, but only abnormal results are displayed) Labs Reviewed - No data to display  EKG   Radiology DG Forearm Right  Result Date: 11/04/2020 CLINICAL DATA:  60 year old male status post fall with pain. EXAM: RIGHT FOREARM - 2 VIEW COMPARISON:  Right humerus series today. FINDINGS: Bone mineralization is within normal limits. Alignment appears preserved  at the right wrist and  elbow. There is no evidence of fracture or other focal bone lesions. No discrete soft tissue injury. IMPRESSION: No acute fracture or dislocation identified about the right forearm. Electronically Signed   By: Genevie Ann M.D.   On: 11/04/2020 09:50   DG Wrist Complete Left  Result Date: 11/04/2020 CLINICAL DATA:  60 year old male status post fall with pain. EXAM: LEFT WRIST - COMPLETE 3+ VIEW COMPARISON:  Left finger series 07/05/2003. FINDINGS: Bone mineralization is within normal limits for age. There is no evidence of fracture or dislocation. There is no evidence of arthropathy or other focal bone abnormality. Soft tissues are unremarkable. IMPRESSION: No acute fracture or dislocation identified about the left wrist. Electronically Signed   By: Genevie Ann M.D.   On: 11/04/2020 09:48   DG Humerus Right  Result Date: 11/04/2020 CLINICAL DATA:  60 year old male status post fall with pain. EXAM: RIGHT HUMERUS - 2+ VIEW COMPARISON:  Chest radiograph 07/05/2003. FINDINGS: Bone mineralization is within normal limits. Alignment appears preserved at the right shoulder and elbow. There is no evidence of fracture or other focal bone lesions. Negative visible right ribs and chest. IMPRESSION: No acute fracture or dislocation identified about the right humerus. Electronically Signed   By: Genevie Ann M.D.   On: 11/04/2020 09:49    Procedures Procedures (including critical care time)  Medications Ordered in UC Medications  ketorolac (TORADOL) 30 MG/ML injection 30 mg (30 mg Intramuscular Given 11/04/20 1015)    Initial Impression / Assessment and Plan / UC Course  I have reviewed the triage vital signs and the nursing notes.  Pertinent labs & imaging results that were available during my care of the patient were reviewed by me and considered in my medical decision making (see chart for details).    Multiple injuries, plain film imaging negative for any fracture or dislocation involving  affected extremities.  Patient has been placed in a Ace wrap for the left wrist and a immobilizer to help with pain associated with movement of the right elbow and right forearm.  Toradol IM injection given here in clinic for pain.  Outpatient management with prednisone taper along with Zanaflex 4 mg every 6 hours.  Patient advised to follow-up with orthopedics if symptoms worsen or do not improve. Final Clinical Impressions(s) / UC Diagnoses   Final diagnoses:  Wrist sprain, left, initial encounter  Elbow sprain, right, initial encounter  Arthralgia of right forearm     Discharge Instructions      Start prednisone taper today. Tizanidine for acute pain. Recommend elevation and application of ice to elbow and wrist to reduce swelling.       ED Prescriptions     Medication Sig Dispense Auth. Provider   predniSONE (DELTASONE) 20 MG tablet Take 3 PO QAM x3days, 2 PO QAM x3days, 1 PO QAM x3days 18 tablet Scot Jun, FNP   tiZANidine (ZANAFLEX) 4 MG tablet Take 1 tablet (4 mg total) by mouth every 6 (six) hours as needed for muscle spasms. 30 tablet Scot Jun, FNP      PDMP not reviewed this encounter.   Scot Jun, FNP 11/04/20 1020

## 2020-12-04 ENCOUNTER — Other Ambulatory Visit: Payer: Self-pay

## 2020-12-04 ENCOUNTER — Encounter: Payer: Self-pay | Admitting: Orthopedic Surgery

## 2020-12-04 ENCOUNTER — Ambulatory Visit: Payer: Managed Care, Other (non HMO)

## 2020-12-04 ENCOUNTER — Ambulatory Visit: Payer: Managed Care, Other (non HMO) | Admitting: Orthopedic Surgery

## 2020-12-04 VITALS — BP 168/101 | HR 70 | Ht 66.0 in | Wt 199.4 lb

## 2020-12-04 DIAGNOSIS — M25532 Pain in left wrist: Secondary | ICD-10-CM | POA: Diagnosis not present

## 2020-12-04 DIAGNOSIS — M25521 Pain in right elbow: Secondary | ICD-10-CM

## 2020-12-04 DIAGNOSIS — S52121A Displaced fracture of head of right radius, initial encounter for closed fracture: Secondary | ICD-10-CM | POA: Diagnosis not present

## 2020-12-04 NOTE — Patient Instructions (Signed)
Work note for today

## 2020-12-05 ENCOUNTER — Encounter: Payer: Self-pay | Admitting: Orthopedic Surgery

## 2020-12-05 NOTE — Progress Notes (Signed)
New Patient Visit  Assessment: Daniel Nolan is a 60 y.o. male with the following: 1. Closed displaced fracture of head of right radius, initial encounter 2. Pain in left wrist; wrist sprain after a fall  Plan: Patient had persistent pain in his elbow and left wrist after a fall.  Repeat radiographs today demonstrates a minimally displaced fracture of the right radial head.  His injury was over a month ago, and he has excellent range of motion.  He will continue to improve.  I advised him to be careful lifting heavy objects at this time, but otherwise he is not limited in his activities.  Regarding his left wrist, there is no obvious injury on x-rays.  There is a cystic appearance to the ulnar styloid, but no obvious injury.  This should improve with time as well.  Medications as needed.  Follow-up as needed.   Follow-up: Return if symptoms worsen or fail to improve.  Subjective:  Chief Complaint  Patient presents with   Elbow Pain    Right elbow// DOI 11/04/20//pt fell   Wrist Pain    Pt states he still can't put pressure on it    History of Present Illness: Daniel Nolan is a 60 y.o. male who presents for evaluation of right elbow and left wrist pain.  Approximately 1 month ago, he states he fell after losing his balance.  He landed with a bent right elbow, and on outstretched left hand.  He noticed immediate pain at that time.  He presented for x-rays, which were negative.  However, he continues to have pain in his right elbow in particular.  He states his left wrist is improving.  He notes sharp pains when he tries to push himself off the ground using his right arm.  Pain is located in the lateral elbow.  He is not taking a medication on a consistent basis.  He has continues to work as a Dealer.  Following the injury, he wore a sling for a few days, and then started using his arm on a more regular basis.   Review of Systems: No fevers or chills No numbness or tingling No  chest pain No shortness of breath No bowel or bladder dysfunction No GI distress No headaches   Medical History:  Past Medical History:  Diagnosis Date   Anxiety    Arthritis    Complication of anesthesia    awaken during upper GI endoscopy    HCV (hepatitis C virus)    History of kidney stones    MRSA (methicillin resistant Staphylococcus aureus) infection 2005   left pinky finger.  I&D x 2 Dr Amedeo Plenty   Wears glasses     Past Surgical History:  Procedure Laterality Date   APPENDECTOMY  05/28/2005   Dr Irving Shows   colonscopy     INCISION / DRAINAGE HAND / FINGER  2005   Incision and drainage left hand deep abscess with   INGUINAL HERNIA REPAIR Left 04/17/2015   Procedure: LAPAROSCOPIC  BILATERAL INGUINAL AND LEFT FEMORAL HERNIA REPAIRS WITH MESH;  Surgeon: Michael Boston, MD;  Location: [facility];  Service: General;  Laterality: Left;   INSERTION OF MESH Bilateral 04/17/2015   Procedure: INSERTION OF MESH;  Surgeon: Michael Boston, MD;  Location: [facility];  Service: General;  Laterality: Bilateral;  AND MESH INSERTED IN ABDOMEN ALSO   LIVER BIOPSY N/A 04/17/2015   Procedure: NEEDLE LIVER BIOPSY;  Surgeon: Michael Boston, MD;  Location: [facility];  Service: General;  Laterality: N/A;   UPPER GI ENDOSCOPY     VASECTOMY  2006   VENTRAL HERNIA REPAIR N/A 04/17/2015   Procedure: LAPAROSCOPIC INCISIONAL WALL HERNIA  REPAIR WITH MESH;  Surgeon: Michael Boston, MD;  Location: [facility];  Service: General;  Laterality: N/A;    Family History  Problem Relation Age of Onset   Cancer Mother        brain   Heart disease Father    Social History   Tobacco Use   Smoking status: Former    Packs/day: 1.50    Years: 8.00    Pack years: 12.00    Types: Cigarettes    Quit date: 11/21/1976    Years since quitting: 44.0   Smokeless tobacco: Never  Substance Use Topics   Alcohol use: Yes    Comment: occasionally    Drug use: Yes    Types: Marijuana    Comment: has not used drugs since 1970's    No  Known Allergies  Current Meds  Medication Sig   APPLE CIDER VINEGAR PO Take by mouth.   cetirizine (ZYRTEC) 10 MG tablet Take 1 tablet (10 mg total) by mouth daily.   Cyanocobalamin (VITAMIN B12 PO) Take 1 tablet by mouth daily.   fluticasone (FLONASE) 50 MCG/ACT nasal spray Place 2 sprays into both nostrils daily.   lisinopril (ZESTRIL) 5 MG tablet Take 5 mg by mouth daily.   naproxen (NAPROSYN) 500 MG tablet Take 1 tablet (500 mg total) by mouth 2 (two) times daily with a meal.   Omega-3 Fatty Acids (FISH OIL PO) Take 4 capsules by mouth daily.   tiZANidine (ZANAFLEX) 4 MG tablet Take 1 tablet (4 mg total) by mouth every 6 (six) hours as needed for muscle spasms.   TURMERIC PO Take 2 capsules by mouth daily.    Objective: BP (!) 168/101   Pulse 70   Ht '5\' 6"'$  (1.676 m)   Wt 199 lb 6.4 oz (90.4 kg)   BMI 32.18 kg/m   Physical Exam:  General: Alert and oriented. and No acute distress. Gait: Normal gait.  Evaluation of right elbow demonstrates no swelling.  No bruising is appreciated.  He has point tenderness at the common insertion of the extensor tendons.  Mild discomfort with resisted extension of the wrist on the long finger.  Exquisite tenderness to palpation directly over the radial head.  He has full range of motion of the right elbow, including pronation and supination.  Sensation is intact throughout the right hand..  Warm and well-perfused.  Evaluation left wrist demonstrates no deformity.  No swelling is appreciated.  No ecchymosis.  He has near full range of motion with tenderness over the ulnar aspect of the wrist.  Tenderness to palpation within the TFCC.  Pain with ulnar deviation.  Fingers are warm and well-perfused.  2+ radial pulse.  Sensation is intact throughout the left hand.  IMAGING: I personally ordered and reviewed the following images  X-rays of the right elbow were obtained in clinic today and demonstrates a minimally displaced fracture of the radial head.   No other injuries are noted.  The elbow is appropriately reduced.  Impression: Minimally displaced right radial head fracture  X-rays of the left wrist demonstrates no acute injury.  Overall alignment of the distal radius is within normal limits.  The ulnar styloid is intact, but there are some small cysts within the styloid.  Otherwise normal-appearing left wrist.  Impression: Normal left wrist x-rays  New Medications:  No orders of the defined types were placed in this encounter.     Mordecai Rasmussen, MD  12/05/2020 8:29 AM

## 2021-04-29 ENCOUNTER — Other Ambulatory Visit: Payer: Self-pay | Admitting: Nurse Practitioner

## 2021-04-29 DIAGNOSIS — K7469 Other cirrhosis of liver: Secondary | ICD-10-CM

## 2021-05-12 ENCOUNTER — Ambulatory Visit
Admission: RE | Admit: 2021-05-12 | Discharge: 2021-05-12 | Disposition: A | Discharge status: Home / Self Care | Payer: Managed Care, Other (non HMO) | Source: Ambulatory Visit | Attending: Nurse Practitioner | Admitting: Nurse Practitioner

## 2021-05-12 DIAGNOSIS — K7469 Other cirrhosis of liver: Secondary | ICD-10-CM

## 2021-09-02 IMAGING — DX DG FOREARM 2V*R*
2 series · 2 of 2 positions shown · non-contrast
Comparison: Right humerus series today.

CLINICAL DATA: 60-year-old male status post fall with pain.

EXAM:
RIGHT FOREARM - 2 VIEW

[forearm ap]
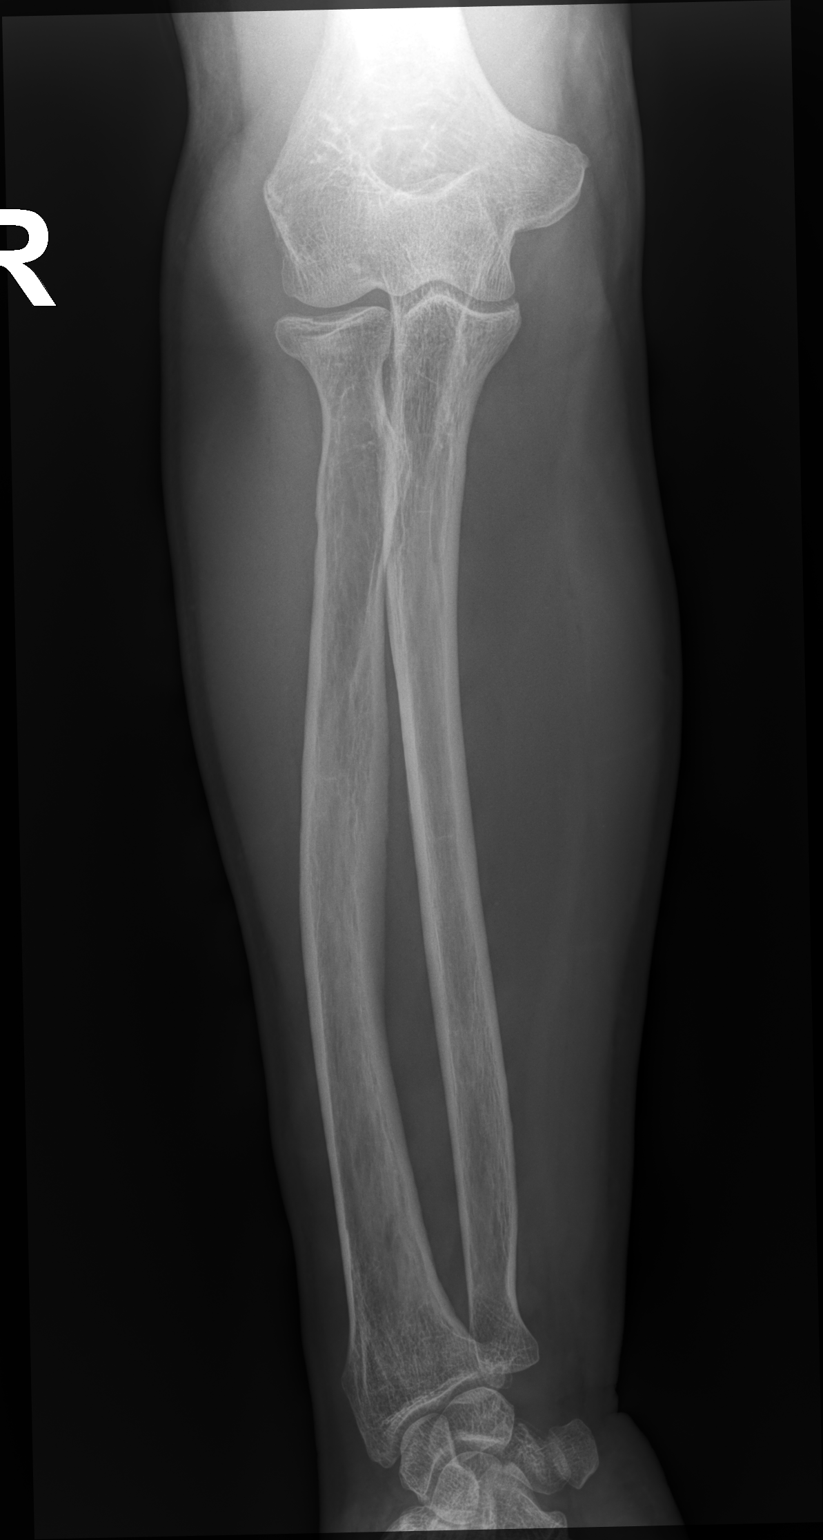

[forearm lat]
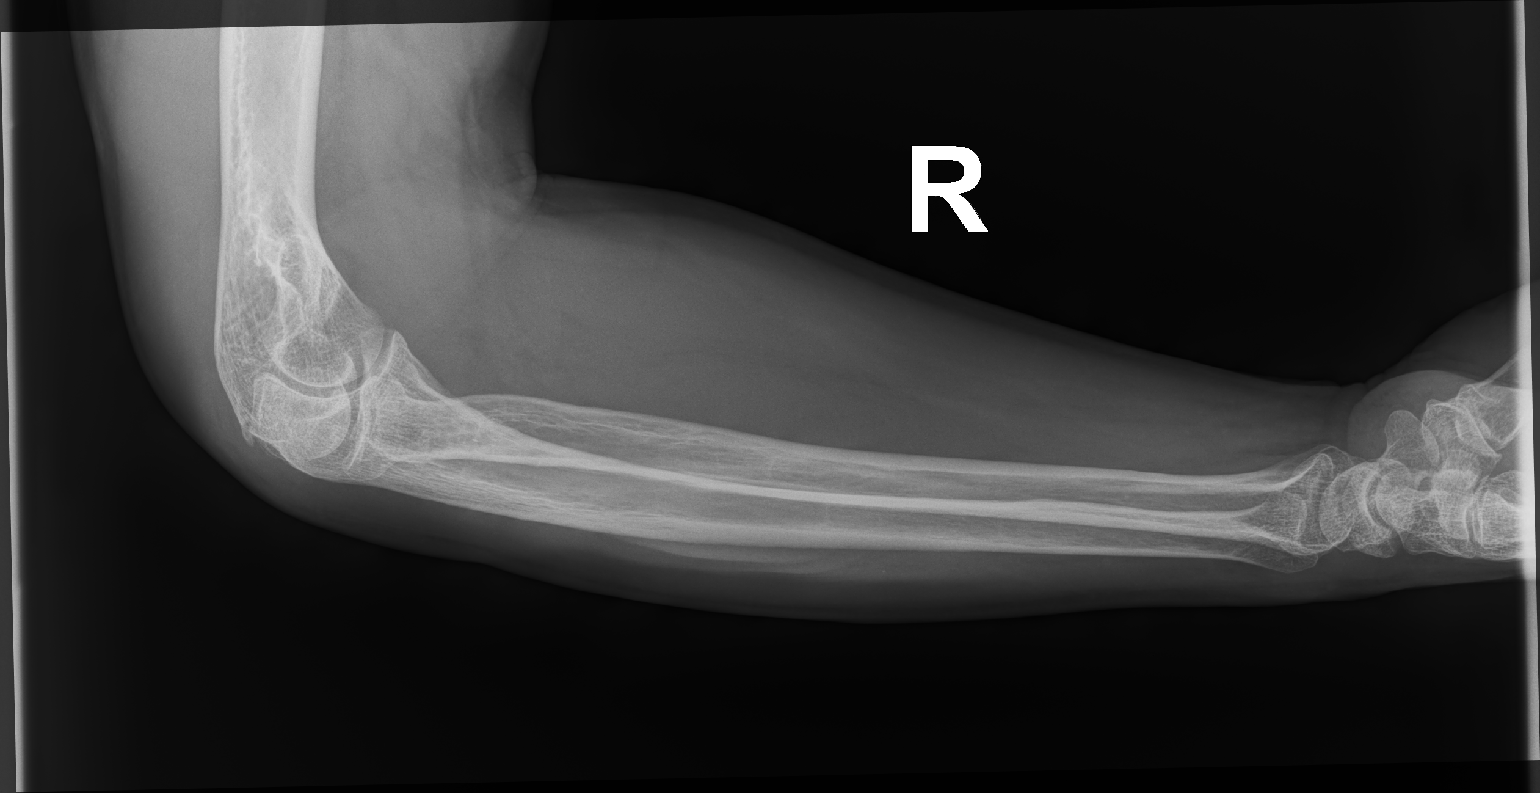

[2 of 2 positions shown; findings below may reference images not displayed]

FINDINGS: Bone mineralization is within normal limits. Alignment appears
preserved at the right wrist and elbow. There is no evidence of
fracture or other focal bone lesions. No discrete soft tissue
injury.
IMPRESSION: No acute fracture or dislocation identified about the right forearm.

## 2021-11-10 ENCOUNTER — Other Ambulatory Visit: Payer: Self-pay | Admitting: Nurse Practitioner

## 2021-11-10 DIAGNOSIS — K7469 Other cirrhosis of liver: Secondary | ICD-10-CM

## 2021-11-17 ENCOUNTER — Ambulatory Visit
Admission: RE | Admit: 2021-11-17 | Discharge: 2021-11-17 | Disposition: A | Discharge status: Home / Self Care | Payer: Managed Care, Other (non HMO) | Source: Ambulatory Visit | Attending: Nurse Practitioner | Admitting: Nurse Practitioner

## 2021-11-17 DIAGNOSIS — K7469 Other cirrhosis of liver: Secondary | ICD-10-CM

## 2022-01-17 ENCOUNTER — Ambulatory Visit
Admission: EM | Admit: 2022-01-17 | Discharge: 2022-01-17 | Disposition: A | Discharge status: Home / Self Care | Payer: Managed Care, Other (non HMO) | Attending: Nurse Practitioner | Admitting: Nurse Practitioner

## 2022-01-17 DIAGNOSIS — Z79899 Other long term (current) drug therapy: Secondary | ICD-10-CM | POA: Insufficient documentation

## 2022-01-17 DIAGNOSIS — Z1152 Encounter for screening for COVID-19: Secondary | ICD-10-CM | POA: Insufficient documentation

## 2022-01-17 DIAGNOSIS — J069 Acute upper respiratory infection, unspecified: Secondary | ICD-10-CM | POA: Insufficient documentation

## 2022-01-17 LAB — RESP PANEL BY RT-PCR (FLU A&B, COVID) ARPGX2
Influenza A by PCR: NEGATIVE
Influenza B by PCR: NEGATIVE
SARS Coronavirus 2 by RT PCR: NEGATIVE

## 2022-01-17 MED ORDER — CETIRIZINE HCL 10 MG PO TABS: 10.0000 mg | ORAL_TABLET | Freq: Every day | ORAL | 0 refills | Status: AC

## 2022-01-17 MED ORDER — FLUTICASONE PROPIONATE 50 MCG/ACT NA SUSP: 2.0000 | Freq: Every day | NASAL | 0 refills | Status: AC

## 2022-01-17 MED ORDER — PSEUDOEPH-BROMPHEN-DM 30-2-10 MG/5ML PO SYRP: 5.0000 mL | ORAL_SOLUTION | Freq: Four times a day (QID) | ORAL | 0 refills | Status: AC | PRN

## 2022-01-17 NOTE — Discharge Instructions (Addendum)
COVID test is pending. If your test result is positive, you are a candidate to receive molnipiravir as an antival. Take medication as directed. Increase fluids and get plenty of rest. May take over-the-counter ibuprofen or Tylenol as needed for pain, fever, or general discomfort. Recommend normal saline nasal spray to help with nasal congestion throughout the day. For your cough, it may be helpful to use a humidifier at bedtime during sleep. If your symptoms fail to improve within the next 7 to 10 days, please follow up in this clinic or with your PCP.

## 2022-01-17 NOTE — ED Provider Notes (Signed)
[facility]    CSN: [id number] Arrival date & time: 01/17/22  1047      History   Chief Complaint No chief complaint on file.   HPI Daniel Nolan is a 61 y.o. male.   The history is provided by the patient.   Patient presents for complaints of headache, nasal congestion, sore throat and cough that started 1 day ago.  Patient reports he thinks he has a "sinus infection".  He denies fever, chills, ear pain, wheezing, shortness of breath, difficulty breathing, or GI symptoms.  He reports that he has been taking Sudafed for his symptoms with some relief.  He denies any known sick contacts.  Reports that he has not been vaccinated for COVID.  Denies any history of seasonal allergies or asthma.  Past Medical History:  Diagnosis Date   Anxiety    Arthritis    Complication of anesthesia    awaken during upper GI endoscopy    HCV (hepatitis C virus)    History of kidney stones    MRSA (methicillin resistant Staphylococcus aureus) infection 2005   left pinky finger.  I&D x 2 Dr Amedeo Plenty   Wears glasses     Patient Active Problem List   Diagnosis Date Noted   Hepatic cirrhosis ([place]) 10/16/2014   Incisional hernia, umbilical - incarcerated 08/18/2013   Obesity (BMI 30-39.9) 08/18/2013   HCV (hepatitis C virus)    Arthritis     Past Surgical History:  Procedure Laterality Date   APPENDECTOMY  05/28/2005   Dr Irving Shows   colonscopy     INCISION / DRAINAGE HAND / FINGER  2005   Incision and drainage left hand deep abscess with   INGUINAL HERNIA REPAIR Left 04/17/2015   Procedure: LAPAROSCOPIC  BILATERAL INGUINAL AND LEFT FEMORAL HERNIA REPAIRS WITH MESH;  Surgeon: Michael Boston, MD;  Location: [facility];  Service: General;  Laterality: Left;   INSERTION OF MESH Bilateral 04/17/2015   Procedure: INSERTION OF MESH;  Surgeon: Michael Boston, MD;  Location: [facility];  Service: General;  Laterality: Bilateral;  AND MESH INSERTED IN ABDOMEN ALSO   LIVER BIOPSY N/A 04/17/2015    Procedure: NEEDLE LIVER BIOPSY;  Surgeon: Michael Boston, MD;  Location: [facility];  Service: General;  Laterality: N/A;   UPPER GI ENDOSCOPY     VASECTOMY  2006   VENTRAL HERNIA REPAIR N/A 04/17/2015   Procedure: LAPAROSCOPIC INCISIONAL WALL HERNIA  REPAIR WITH MESH;  Surgeon: Michael Boston, MD;  Location: [facility];  Service: General;  Laterality: N/A;       Home Medications    Prior to Admission medications   Medication Sig Start Date End Date Taking? Authorizing Provider  brompheniramine-pseudoephedrine-DM 30-2-10 MG/5ML syrup Take 5 mLs by mouth 4 (four) times daily as needed. 01/17/22  Yes Myah Guynes-Warren, Alda Lea, NP  cetirizine (ZYRTEC) 10 MG tablet Take 1 tablet (10 mg total) by mouth daily. 01/17/22  Yes Odean Mcelwain-Warren, Alda Lea, NP  fluticasone (FLONASE) 50 MCG/ACT nasal spray Place 2 sprays into both nostrils daily. 01/17/22  Yes Ladonte Verstraete-Warren, Alda Lea, NP  APPLE CIDER VINEGAR PO Take by mouth.    [provider]  Cyanocobalamin (VITAMIN B12 PO) Take 1 tablet by mouth daily.    [provider]  lisinopril (ZESTRIL) 20 MG tablet Take 20 mg by mouth daily. 11/25/21   [provider]  lisinopril (ZESTRIL) 5 MG tablet Take 5 mg by mouth daily.    [provider]  naproxen (NAPROSYN) 500 MG  tablet Take 1 tablet (500 mg total) by mouth 2 (two) times daily with a meal. 04/17/15   Michael Boston, MD  Omega-3 Fatty Acids (FISH OIL PO) Take 4 capsules by mouth daily.    [provider]  tiZANidine (ZANAFLEX) 4 MG tablet Take 1 tablet (4 mg total) by mouth every 6 (six) hours as needed for muscle spasms. 11/04/20   Scot Jun, FNP  TURMERIC PO Take 2 capsules by mouth daily.    [provider]    Family History Family History  Problem Relation Age of Onset   Cancer Mother        brain   Heart disease Father     Social History Social History   Tobacco Use   Smoking status: Former    Packs/day: 1.50    Years: 8.00    Total pack  years: 12.00    Types: Cigarettes    Quit date: 11/21/1976    Years since quitting: 45.1   Smokeless tobacco: Never  Substance Use Topics   Alcohol use: Yes    Comment: occasionally    Drug use: Yes    Types: Marijuana    Comment: has not used drugs since 1970's     Allergies   Patient has no known allergies.   Review of Systems Review of Systems Per HPI  Physical Exam Triage Vital Signs ED Triage Vitals  Enc Vitals Group     BP 01/17/22 1107 (!) 142/85     Pulse Rate 01/17/22 1107 69     Resp 01/17/22 1107 20     Temp 01/17/22 1107 98.1 F (36.7 C)     Temp Source 01/17/22 1107 Oral     SpO2 01/17/22 1107 98 %     Weight --      Height --      Head Circumference --      Peak Flow --      Pain Score 01/17/22 1111 4     Pain Loc --      Pain Edu? --      Excl. in [place]? --    No data found.  Updated Vital Signs BP (!) 142/85 (BP Location: Right Arm)   Pulse 69   Temp 98.1 F (36.7 C) (Oral)   Resp 20   SpO2 98%   Visual Acuity Right Eye Distance:   Left Eye Distance:   Bilateral Distance:    Right Eye Near:   Left Eye Near:    Bilateral Near:     Physical Exam Vitals and nursing note reviewed.  Constitutional:      General: He is not in acute distress.    Appearance: Normal appearance.  HENT:     Head: Normocephalic.     Right Ear: Tympanic membrane, ear canal and external ear normal.     Left Ear: Tympanic membrane, ear canal and external ear normal.     Nose: Congestion present.     Right Turbinates: Enlarged and swollen.     Left Turbinates: Enlarged and swollen.     Right Sinus: No maxillary sinus tenderness or frontal sinus tenderness.     Left Sinus: No maxillary sinus tenderness or frontal sinus tenderness.     Mouth/Throat:     Lips: Pink.     Mouth: Mucous membranes are moist.     Pharynx: Pharyngeal swelling and posterior oropharyngeal erythema present.     Tonsils: 1+ on the right. 1+ on the left.  Comments: Cobblestoning present  on posterior tongue Eyes:     Extraocular Movements: Extraocular movements intact.     Conjunctiva/sclera: Conjunctivae normal.     Pupils: Pupils are equal, round, and reactive to light.  Cardiovascular:     Rate and Rhythm: Normal rate and regular rhythm.     Pulses: Normal pulses.     Heart sounds: Normal heart sounds.  Pulmonary:     Effort: Pulmonary effort is normal.     Breath sounds: Normal breath sounds.  Abdominal:     General: Bowel sounds are normal.     Palpations: Abdomen is soft.  Musculoskeletal:     Cervical back: Normal range of motion.  Lymphadenopathy:     Cervical: No cervical adenopathy.  Skin:    General: Skin is warm and dry.  Neurological:     General: No focal deficit present.     Mental Status: He is alert and oriented to person, place, and time.  Psychiatric:        Mood and Affect: Mood normal.        Behavior: Behavior normal.      UC Treatments / Results  Labs (all labs ordered are listed, but only abnormal results are displayed) Labs Reviewed - No data to display  EKG   Radiology No results found.  Procedures Procedures (including critical care time)  Medications Ordered in UC Medications - No data to display  Initial Impression / Assessment and Plan / UC Course  I have reviewed the triage vital signs and the nursing notes.  Pertinent labs & imaging results that were available during my care of the patient were reviewed by me and considered in my medical decision making (see chart for details).  Patient presents with a 1 day history of upper respiratory symptoms.  On exam, patient's vital signs are stable, he is in no acute distress.  COVID test is pending.  Patient would like to be started on antiviral therapy if his COVID test is positive, he is a candidate to receive molnupiravir.  In the interim, will treat patient for an upper respiratory infection with Bromfed cough medicine, cetirizine 10 mg tablets for nasal congestion, and  fluticasone for sinus inflammation and nasal congestion.  Supportive care recommendations were provided to the patient.  Discussed strict indications with the patient of when to follow-up.  Patient verbalizes understanding.  All questions were answered.  Patient is stable for discharge. Final Clinical Impressions(s) / UC Diagnoses   Final diagnoses:  Acute upper respiratory infection     Discharge Instructions      COVID test is pending. If your test result is positive, you are a candidate to receive molnipiravir as an antival. Take medication as directed. Increase fluids and get plenty of rest. May take over-the-counter ibuprofen or Tylenol as needed for pain, fever, or general discomfort. Recommend normal saline nasal spray to help with nasal congestion throughout the day. For your cough, it may be helpful to use a humidifier at bedtime during sleep. If your symptoms fail to improve within the next 7 to 10 days, please follow up in this clinic or with your PCP.      ED Prescriptions     Medication Sig Dispense Auth. Provider   brompheniramine-pseudoephedrine-DM 30-2-10 MG/5ML syrup Take 5 mLs by mouth 4 (four) times daily as needed. 140 mL Isatu Macinnes-Warren, Alda Lea, NP   fluticasone (FLONASE) 50 MCG/ACT nasal spray Place 2 sprays into both nostrils daily. 16 g Curran Lenderman-Warren, Alda Lea, NP  cetirizine (ZYRTEC) 10 MG tablet Take 1 tablet (10 mg total) by mouth daily. 30 tablet Jinger Middlesworth-Warren, Alda Lea, NP      PDMP not reviewed this encounter.   Tish Men, NP 01/17/22 1153

## 2022-01-17 NOTE — ED Triage Notes (Signed)
Pt reports he think he has sinus infection since yesterday and a deep headache. Took a sudafed which provided slight relief.Sore throat

## 2022-03-10 IMAGING — US US ABDOMEN LIMITED
1 series · 14 of 25 positions shown · non-contrast
Comparison: 09/16/2020

CLINICAL DATA: 60-year-old male with liver cirrhosis

EXAM:
ULTRASOUND ABDOMEN LIMITED RIGHT UPPER QUADRANT

[Series 1: us abdomen limited · 0.18mm/px · 14 of 49 slices shown]
[im 1/49]
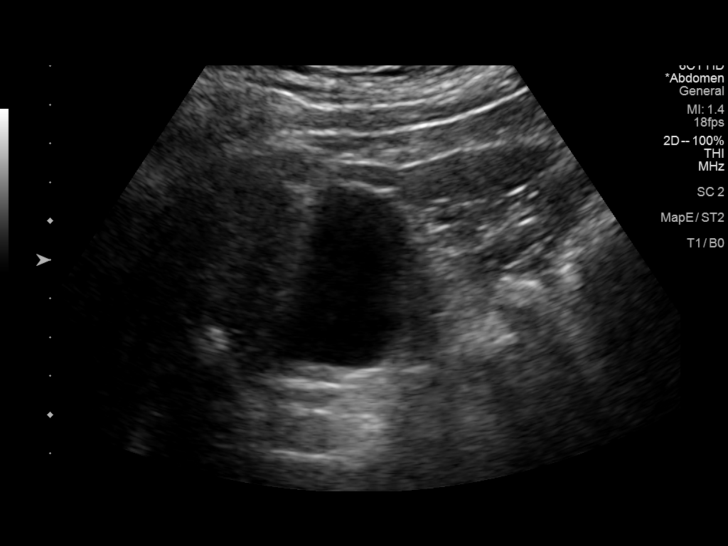
[im 5/49]
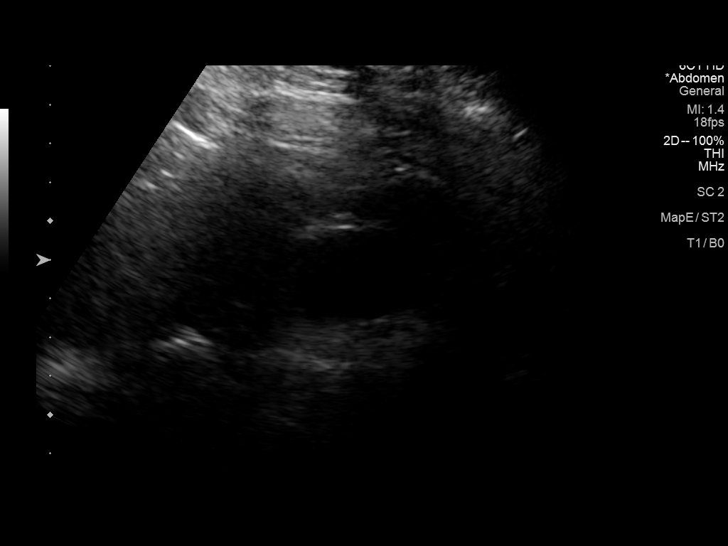
[im 9/49]
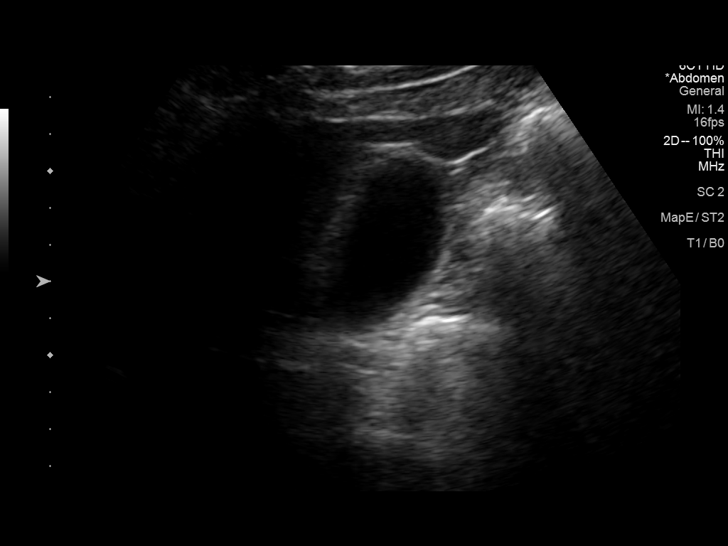
[im 13/49]
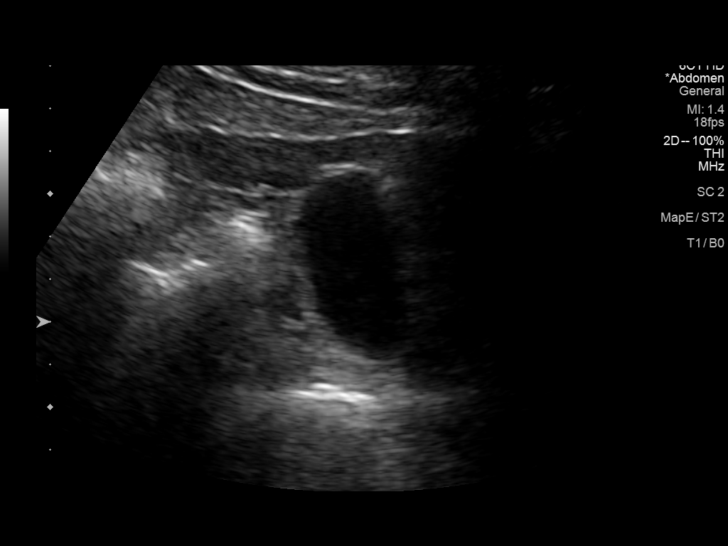
[im 17/49]
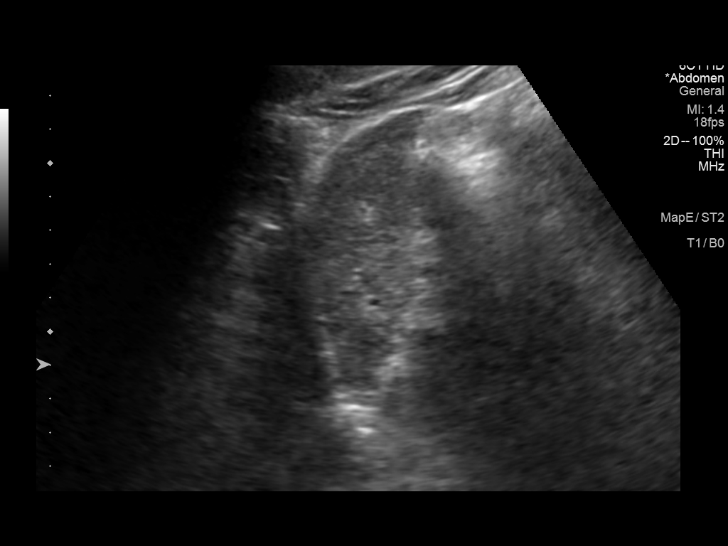
[im 19/49]
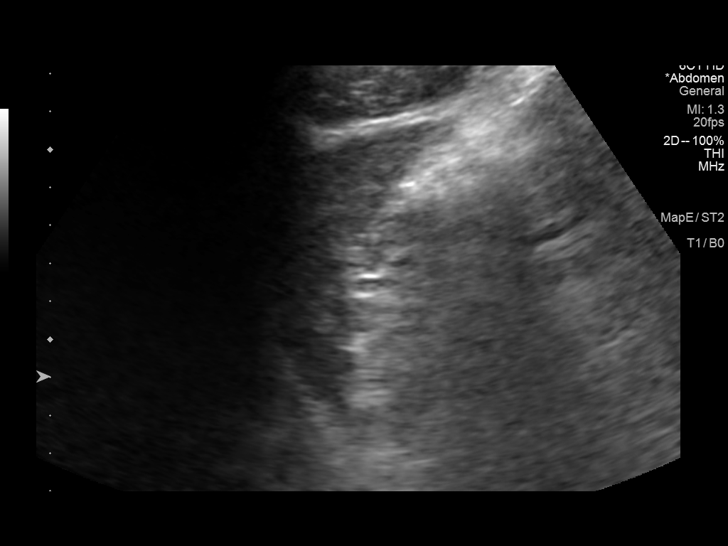
[im 23/49]
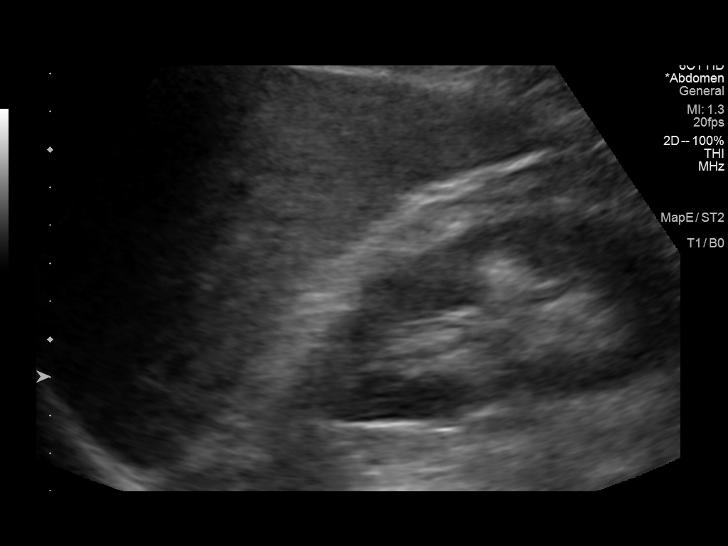
[im 27/49]
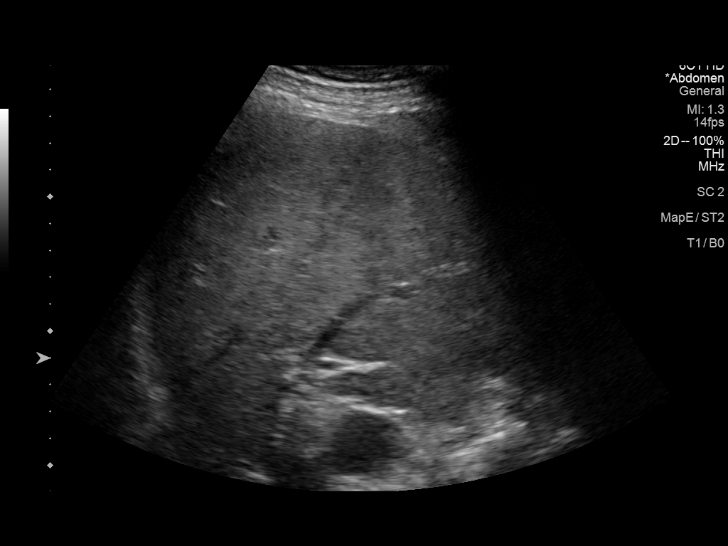
[im 31/49]
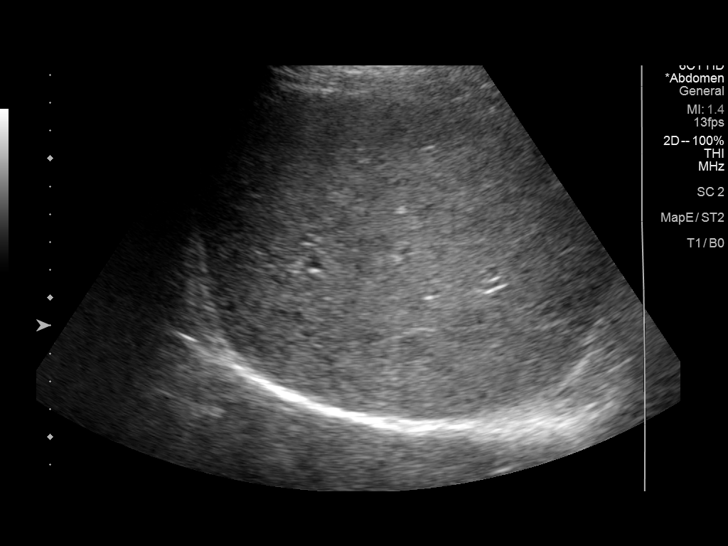
[im 33/49]
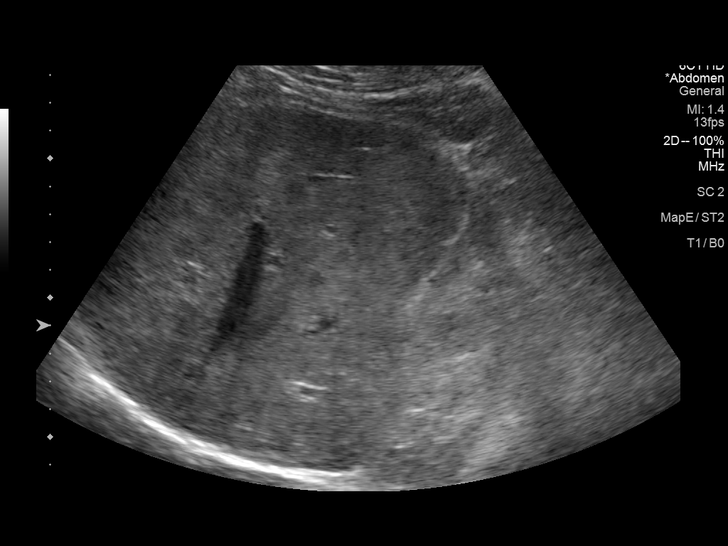
[im 37/49]
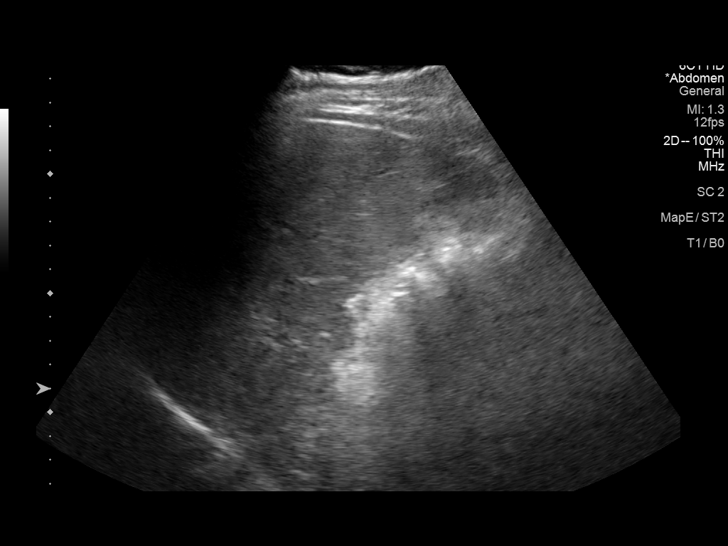
[im 41/49]
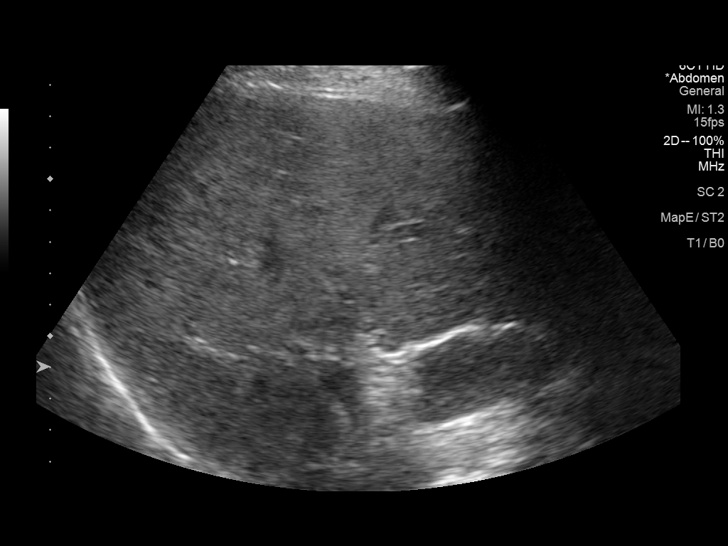
[im 45/49]
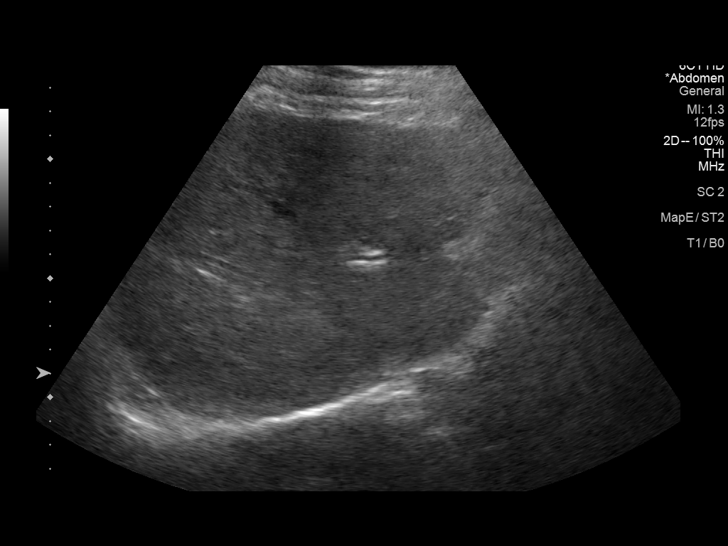
[im 49/49]
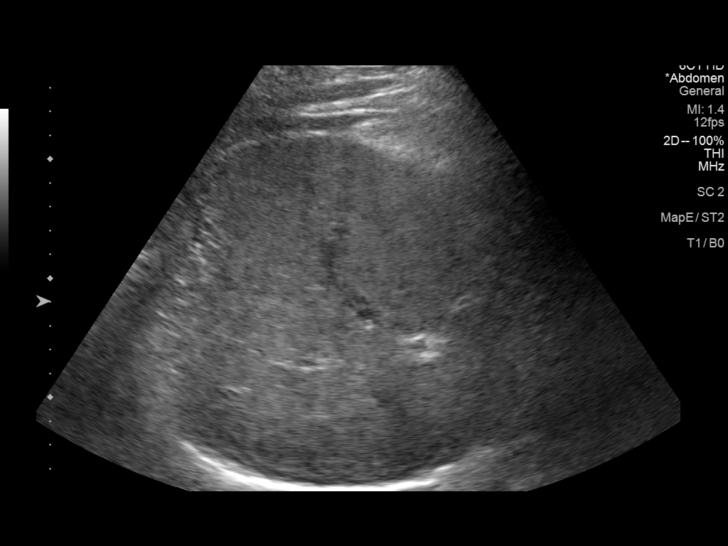

[14 of 25 positions shown; findings below may reference images not displayed]

FINDINGS: Gallbladder:

No gallstones or wall thickening visualized. No sonographic Murphy
sign noted by sonographer.

Common bile duct:

Diameter: 3 mm-4 mm

Liver:

Questionable nodular changes of the liver surface, with no
high-resolution images performed. No focal liver lesion identified.
Diffusely increased echogenicity/coarsened echotexture of liver
parenchyma. Portal vein is patent on color Doppler imaging with
normal direction of blood flow towards the liver.

Other: None.
IMPRESSION: Negative for cholelithiasis.

Equivocal ultrasound findings for cirrhosis, as there are no
high-resolution images of the liver surface to confirm nodular
contour, however, the echotexture of the liver is heterogeneous
suggesting medical liver disease.

## 2022-05-21 ENCOUNTER — Other Ambulatory Visit: Payer: Self-pay | Admitting: Nurse Practitioner

## 2022-05-21 ENCOUNTER — Encounter: Payer: Self-pay | Admitting: Radiology

## 2022-05-21 DIAGNOSIS — K7469 Other cirrhosis of liver: Secondary | ICD-10-CM

## 2022-06-22 ENCOUNTER — Ambulatory Visit
Admission: RE | Admit: 2022-06-22 | Discharge: 2022-06-22 | Disposition: A | Discharge status: Home / Self Care | Payer: Managed Care, Other (non HMO) | Source: Ambulatory Visit | Attending: Nurse Practitioner | Admitting: Nurse Practitioner

## 2022-06-22 DIAGNOSIS — K7469 Other cirrhosis of liver: Secondary | ICD-10-CM

## 2022-11-20 ENCOUNTER — Other Ambulatory Visit: Payer: Self-pay | Admitting: Nurse Practitioner

## 2022-11-20 DIAGNOSIS — K7469 Other cirrhosis of liver: Secondary | ICD-10-CM

## 2022-11-30 ENCOUNTER — Other Ambulatory Visit (HOSPITAL_COMMUNITY): Payer: Self-pay | Admitting: Nurse Practitioner

## 2022-11-30 DIAGNOSIS — K7469 Other cirrhosis of liver: Secondary | ICD-10-CM

## 2022-12-10 ENCOUNTER — Encounter (HOSPITAL_COMMUNITY): Payer: Self-pay

## 2022-12-10 ENCOUNTER — Ambulatory Visit (HOSPITAL_COMMUNITY): Payer: Managed Care, Other (non HMO)

## 2023-06-02 ENCOUNTER — Other Ambulatory Visit: Payer: Self-pay | Admitting: Nurse Practitioner

## 2023-06-02 DIAGNOSIS — K7469 Other cirrhosis of liver: Secondary | ICD-10-CM

## 2023-06-10 ENCOUNTER — Ambulatory Visit
Admission: RE | Admit: 2023-06-10 | Discharge: 2023-06-10 | Disposition: A | Discharge status: Home / Self Care | Source: Ambulatory Visit | Attending: Nurse Practitioner | Admitting: Nurse Practitioner

## 2023-06-10 DIAGNOSIS — K7469 Other cirrhosis of liver: Secondary | ICD-10-CM

## 2023-08-10 DIAGNOSIS — I10 Essential (primary) hypertension: Secondary | ICD-10-CM | POA: Diagnosis not present

## 2023-08-10 DIAGNOSIS — F40240 Claustrophobia: Secondary | ICD-10-CM | POA: Diagnosis not present

## 2023-08-10 DIAGNOSIS — Z0000 Encounter for general adult medical examination without abnormal findings: Secondary | ICD-10-CM | POA: Diagnosis not present

## 2023-08-10 DIAGNOSIS — E782 Mixed hyperlipidemia: Secondary | ICD-10-CM | POA: Diagnosis not present

## 2023-08-10 DIAGNOSIS — Z125 Encounter for screening for malignant neoplasm of prostate: Secondary | ICD-10-CM | POA: Diagnosis not present

## 2023-08-10 DIAGNOSIS — K7460 Unspecified cirrhosis of liver: Secondary | ICD-10-CM | POA: Diagnosis not present

## 2023-08-10 DIAGNOSIS — E118 Type 2 diabetes mellitus with unspecified complications: Secondary | ICD-10-CM | POA: Diagnosis not present

## 2023-12-17 ENCOUNTER — Other Ambulatory Visit: Payer: Self-pay | Admitting: Nurse Practitioner

## 2023-12-17 DIAGNOSIS — Z789 Other specified health status: Secondary | ICD-10-CM | POA: Diagnosis not present

## 2023-12-17 DIAGNOSIS — K7469 Other cirrhosis of liver: Secondary | ICD-10-CM

## 2023-12-21 DIAGNOSIS — E782 Mixed hyperlipidemia: Secondary | ICD-10-CM | POA: Diagnosis not present

## 2023-12-21 DIAGNOSIS — I10 Essential (primary) hypertension: Secondary | ICD-10-CM | POA: Diagnosis not present

## 2023-12-21 DIAGNOSIS — E118 Type 2 diabetes mellitus with unspecified complications: Secondary | ICD-10-CM | POA: Diagnosis not present

## 2023-12-22 ENCOUNTER — Ambulatory Visit
Admission: RE | Admit: 2023-12-22 | Discharge: 2023-12-22 | Disposition: A | Discharge status: Home / Self Care | Source: Ambulatory Visit | Attending: Nurse Practitioner | Admitting: Nurse Practitioner

## 2023-12-22 DIAGNOSIS — K7469 Other cirrhosis of liver: Secondary | ICD-10-CM

## 2023-12-22 DIAGNOSIS — K7460 Unspecified cirrhosis of liver: Secondary | ICD-10-CM | POA: Diagnosis not present

## 2023-12-23 DIAGNOSIS — Z008 Encounter for other general examination: Secondary | ICD-10-CM | POA: Diagnosis not present

## 2023-12-23 DIAGNOSIS — K7460 Unspecified cirrhosis of liver: Secondary | ICD-10-CM | POA: Diagnosis not present

## 2023-12-23 DIAGNOSIS — Z6832 Body mass index (BMI) 32.0-32.9, adult: Secondary | ICD-10-CM | POA: Diagnosis not present

## 2023-12-23 DIAGNOSIS — E669 Obesity, unspecified: Secondary | ICD-10-CM | POA: Diagnosis not present

## 2023-12-23 DIAGNOSIS — R7303 Prediabetes: Secondary | ICD-10-CM | POA: Diagnosis not present
